# Patient Record
Sex: Female | Born: 1980 | Race: White | Hispanic: No | Marital: Married | Smoking: Former smoker
Health system: Southern US, Community
[De-identification: ages and names within clinical notes are randomized; demographics above are authoritative.]

## PROBLEM LIST (undated history)

## (undated) DIAGNOSIS — N939 Abnormal uterine and vaginal bleeding, unspecified: Secondary | ICD-10-CM

## (undated) DIAGNOSIS — O09529 Supervision of elderly multigravida, unspecified trimester: Secondary | ICD-10-CM

## (undated) DIAGNOSIS — Z3490 Encounter for supervision of normal pregnancy, unspecified, unspecified trimester: Principal | ICD-10-CM

## (undated) DIAGNOSIS — R1032 Left lower quadrant pain: Secondary | ICD-10-CM

## (undated) DIAGNOSIS — N390 Urinary tract infection, site not specified: Secondary | ICD-10-CM

## (undated) DIAGNOSIS — I10 Essential (primary) hypertension: Secondary | ICD-10-CM

## (undated) HISTORY — DX: Abnormal uterine and vaginal bleeding, unspecified: N93.9

## (undated) HISTORY — DX: Encounter for supervision of normal pregnancy, unspecified, unspecified trimester: Z34.90

## (undated) HISTORY — DX: Urinary tract infection, site not specified: N39.0

## (undated) HISTORY — DX: Supervision of elderly multigravida, unspecified trimester: O09.529

## (undated) HISTORY — DX: Left lower quadrant pain: R10.32

---

## 2001-01-17 ENCOUNTER — Ambulatory Visit (HOSPITAL_COMMUNITY): Admission: RE | Admit: 2001-01-17 | Discharge: 2001-01-17 | Payer: Self-pay | Admitting: Urology

## 2001-01-17 ENCOUNTER — Encounter: Payer: Self-pay | Admitting: Urology

## 2001-05-16 ENCOUNTER — Ambulatory Visit (HOSPITAL_COMMUNITY): Admission: RE | Admit: 2001-05-16 | Discharge: 2001-05-16 | Payer: Self-pay | Admitting: Obstetrics and Gynecology

## 2003-05-13 ENCOUNTER — Encounter (HOSPITAL_COMMUNITY): Admission: RE | Admit: 2003-05-13 | Discharge: 2003-05-27 | Payer: Self-pay | Admitting: Rheumatology

## 2003-08-31 ENCOUNTER — Emergency Department (HOSPITAL_COMMUNITY): Admission: EM | Admit: 2003-08-31 | Discharge: 2003-08-31 | Payer: Self-pay | Admitting: *Deleted

## 2004-08-27 HISTORY — PX: ECTOPIC PREGNANCY SURGERY: SHX613

## 2007-11-04 ENCOUNTER — Other Ambulatory Visit: Admission: RE | Admit: 2007-11-04 | Discharge: 2007-11-04 | Payer: Self-pay | Admitting: Obstetrics & Gynecology

## 2008-02-27 ENCOUNTER — Emergency Department (HOSPITAL_COMMUNITY): Admission: EM | Admit: 2008-02-27 | Discharge: 2008-02-27 | Payer: Self-pay | Admitting: Emergency Medicine

## 2008-03-13 ENCOUNTER — Observation Stay (HOSPITAL_COMMUNITY): Admission: AD | Admit: 2008-03-13 | Discharge: 2008-03-14 | Payer: Self-pay | Admitting: Obstetrics and Gynecology

## 2008-04-05 ENCOUNTER — Inpatient Hospital Stay (HOSPITAL_COMMUNITY): Admission: AD | Admit: 2008-04-05 | Discharge: 2008-04-05 | Payer: Self-pay | Admitting: Gynecology

## 2008-04-27 ENCOUNTER — Inpatient Hospital Stay (HOSPITAL_COMMUNITY): Admission: AD | Admit: 2008-04-27 | Discharge: 2008-04-28 | Payer: Self-pay | Admitting: Obstetrics & Gynecology

## 2008-04-27 ENCOUNTER — Ambulatory Visit: Payer: Self-pay | Admitting: Family Medicine

## 2010-07-15 ENCOUNTER — Emergency Department (HOSPITAL_COMMUNITY): Admission: EM | Admit: 2010-07-15 | Discharge: 2010-07-15 | Payer: Self-pay | Admitting: Emergency Medicine

## 2010-11-07 LAB — URINE CULTURE: Colony Count: >=100000

## 2010-11-07 LAB — BASIC METABOLIC PANEL
BUN: 4 mg/dL — ABNORMAL LOW (ref 6–23)
CO2: 30 mEq/L (ref 19–32)
Calcium: 9.7 mg/dL (ref 8.4–10.5)
Creatinine, Ser: 0.72 mg/dL (ref 0.4–1.2)
GFR calc non Af Amer: >60 mL/min (ref >60)
Glucose, Bld: 99 mg/dL (ref 70–99)
Sodium: 140 mEq/L (ref 135–145)

## 2010-11-07 LAB — URINALYSIS, ROUTINE W REFLEX MICROSCOPIC
Bilirubin Urine: NEGATIVE
Glucose, UA: NEGATIVE mg/dL
Ketones, ur: NEGATIVE mg/dL
Nitrite: POSITIVE — AB
Protein, ur: NEGATIVE mg/dL
Specific Gravity, Urine: 1.01 (ref 1.005–1.030)
Urobilinogen, UA: 0.2 mg/dL (ref 0.0–1.0)
pH: 7.5 (ref 5.0–8.0)

## 2010-11-07 LAB — URINE MICROSCOPIC-ADD ON

## 2011-01-09 NOTE — Discharge Summary (Signed)
NAME:  Andrea [name], Andrea [name] NO.:  [id number]   MEDICAL RECORD NO.:  [id number]          PATIENT TYPE:  OBV   LOCATION:  9158                          FACILITY:  [facility]   PHYSICIAN:  Tilda Burrow, M.D. DATE OF BIRTH:  1981/08/08   DATE OF ADMISSION:  03/13/2008  DATE OF DISCHARGE:  03/14/2008                               DISCHARGE SUMMARY   REASON FOR ADMISSION:  Pregnancy at 57 and 6 weeks' gestation with  diarrhea for 2 days with preterm labor.   DISCHARGE DIAGNOSES:  1. Gastroenteritis.  2. Preterm labor 2o to #1   HOSPITAL COURSE:  The patient improved tremendously with IV hydration  and 1 p.o. dose of Procardia XL 30.  At that point, she required no  other tocolysis with hydration.  She responded well to hydrating fluids,  was taking p.o. solids and fluids well without any further diarrhea, and  is to be discharged home today on March 14, 2008.   DISCHARGE MEDICATIONS:  She is going home at this time on prenatal  vitamins.  She requires no further tocolysis at the labor teaching and  the patient is to follow up on Wednesday with Dr. Emelda Fear and Dr. Despina Hidden  at [facility].  She declined vaginal exams since she has not had any  contractions since her IV hydration and p.o. tocolysis.      Zerita Boers, Lanier Clam      Tilda Burrow, M.D.  Electronically Signed    DL/MEDQ  D:  78/29/5621  T:  03/15/2008  Job:  [id number]   cc:   Tilda Burrow, M.D.  Fax: [phone]   [facility]

## 2011-01-12 NOTE — Consult Note (Signed)
NAME:  [name], [name]:  [id number]   MEDICAL RECORD NO.:  [id number]                   PATIENT TYPE:   LOCATION:                                       FACILITY:   PHYSICIAN:  Aundra Dubin, M.D.            DATE OF BIRTH:   DATE OF CONSULTATION:  05/13/2003  DATE OF DISCHARGE:                                   CONSULTATION   Doreen Beam, M.D.  [facility]  [address]  [place], [place] [address]   CHIEF COMPLAINT:  Muscles hurt every day.   May 13, 2003.   Dear Dr. Sherril Croon:   Thank you for this consultation.   Andrea [name] is a 30 year old white female who fell while at work in November,  landing on her right knee and back.  She tells me that she did have back x-  rays and these were normal.  Since this time, she wakes up in pain.  She has  non-restorative sleep.  She has a lot of pressure aching.  She finds that  she is uncomfortable sitting, standing, or lying.  There have been no  swollen joints.  She tells me that she hurts in her shoulders and arms, and  she has difficulty lifting her hands above her shoulders.  Her legs and  calves hurt.  She has no energy and she says that she feels as if she  wants to give up.  She does admit that she has some depressed feelings.  The  notes indicate, as she also tells me, that she has been on Vicodin,  Skelaxin, Flexeril, and some other medications to try and help this achiness  without significant relief.   REVIEW OF SYSTEMS:  On further review of systems, her sleep is  nonrestorative and she tosses and turns.  There has been no fever, rashes,  or psoriasis.  She is quite stiff in the mornings.  She has headaches which  are mild, about once a month.  She has diarrhea, tight BM about twice per  week.  There has been no constipation, blood, or mucus.  There has been no  chest pain or shortness of breath.   PAST MEDICAL HISTORY/SURGICAL HISTORY:  1. Kidney stone in 1998.  2.  Bladder infections.   MEDICINES:  Tylenol.   DRUG INTOLERANCES:  SULFA.   FAMILY HISTORY:  Her father is 3 years of age and has diabetes and a recent  three-vessel CABG.  Her mother is 63 years of age, has diabetes and numbness  to her legs.   SOCIAL HISTORY:  She is an [place] native.  She lives with a man she calls her  fiance for the last three years.  She has a 57-year-old daughter with him.  She completed high school.  She is presently waitressing.  She began smoking  at age 71 and smokes about a half a pack of cigarettes a  day.  No alcohol.   PHYSICAL EXAMINATION:  VITAL SIGNS:  Weight 124 pounds, blood pressure  100/50, respirations 16.  GENERAL:  She is of appropriate weight and appears tired.  SKIN:  She has freckles, but no active rashes.  She does have a tattoo  across her low back.  HEENT:  Normal hair pattern.  PERL/EOMI.  Mouth clear except for a tongue  stud.  NECK:  Slight upper cervical adenopathy.  Normal thyroid.  LUNGS:  Clear.  HEART:  Regular, no murmur.  ABDOMEN:  Negative HSM, nontender.  MUSCULOSKELETAL:  The hands, wrists, elbows, shoulders, and neck have a good  range of motion.  There is mild stiffness, but no pain with movement.  She  is able to fully internally and externally rotate the shoulders.  Trigger  points at the shoulder, neck, occiput, anterior chest, upper paraspinous  muscles, and all through the mid and low back.  These and Achilles were all  tender.  Hips, knees, ankles, and feet have a good range of motion and show  no active arthritis.  NEURO:  Strength is 5/5.  DTRs are 2+ throughout.  Negative SLR.   ASSESSMENT AND PLAN:  1. Polyarthralgia and insomnia: She has an aching, fatigue-type of syndrome     that is much like fibromyalgia.  The real diagnosis may be depression.  I     am electing to try further medicines for sleep at this time.  She is     agreeable to try Flexeril again, and I am adding Xanax to this.  She will     start  with Flexeril 10 mg and Xanax 0.25 mg h.s.  She can increase, with     the limits being 20 mg of Flexeril and 1 mg of Xanax.  In addition, I     have discussed with her that what may be going on is more depression.  I     will not place her on these types of medicines, but this might be a     reasonable place to try an SSRI.  2. Smoking: Strategies to quit were discussed.   Dr. Sherril Croon, as above, if the medicines for sleep are not making much help,  then I would suggest adding an SSRI antidepressant.  Medicine such as  Darvocet might be reasonable for pain, but as I say this, we are already  talking about four medicines at that point.  This is a fatigue, depression,  aching-type syndrome that is essentially fibromyalgia process.  I will be  seeing her on a p.r.n. basis.  As you know, this [facility] is closing at  the end of October.  Thank you.   Sincerely,                                                Aundra Dubin, M.D.    WWT/MEDQ  D:  05/13/2003  T:  05/13/2003  Job:  [id number]   cc:   Doreen Beam  [address]  [place]  [place] [address]  Fax: [phone]

## 2011-05-25 LAB — URINALYSIS, ROUTINE W REFLEX MICROSCOPIC
Bilirubin Urine: NEGATIVE
Hgb urine dipstick: NEGATIVE
Ketones, ur: NEGATIVE
Nitrite: NEGATIVE
Protein, ur: NEGATIVE
Urobilinogen, UA: 0.2

## 2011-05-30 LAB — CBC
MCHC: 34.2
MCV: 101.2 — ABNORMAL HIGH
Platelets: 434 — ABNORMAL HIGH
RBC: 3.23 — ABNORMAL LOW
RDW: 14.3

## 2011-05-30 LAB — RPR: RPR Ser Ql: NONREACTIVE

## 2012-11-17 IMAGING — CT CT ABD-PELV W/O CM
3 of 4 series · 8 of 46 positions shown, 15 images · non-contrast
Comparison: Left side abdominal pain

CLINICAL DATA: Left sided abdominal pain

CT ABDOMEN AND PELVIS WITHOUT CONTRAST
TECHNIQUE: Multidetector CT imaging of the abdomen and pelvis was
performed following the standard protocol without intravenous
contrast.

[Series 3: lung 5.0 b60f · axial · 0.66mm/px · z∈[-102,-42]mm · 4 of 22 slices shown, 9 images]
[im 5/22  soft-tissue]
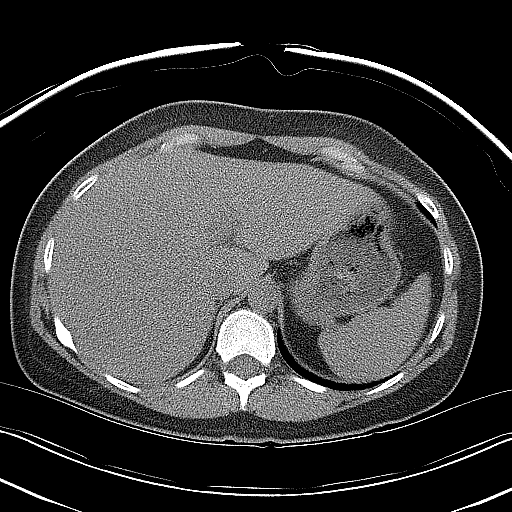
[im 5/22  lung]
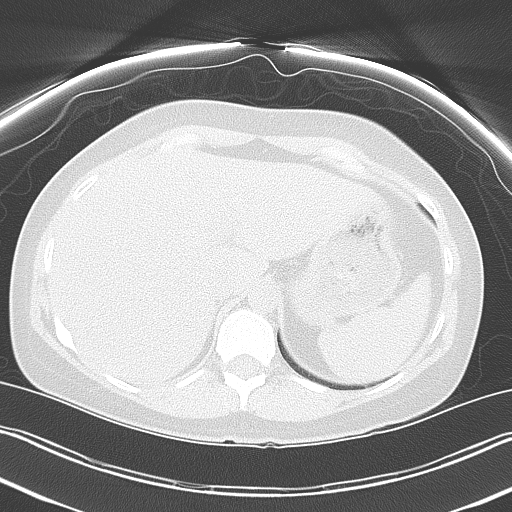
[im 5/22  bone]
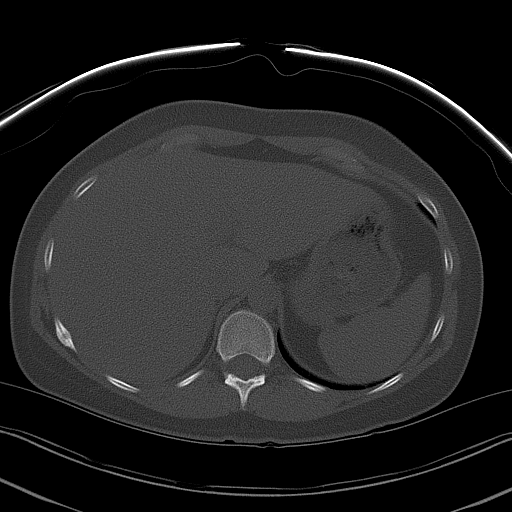
[im 9/22  soft-tissue]
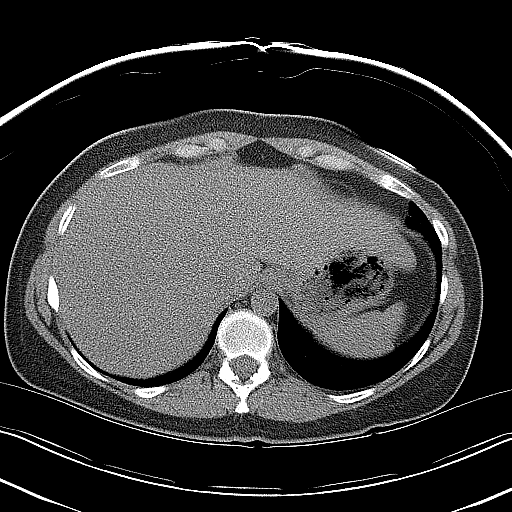
[im 9/22  lung]
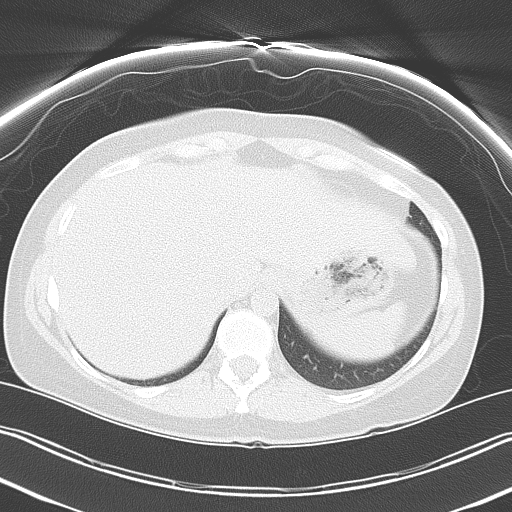
[im 13/22  soft-tissue]
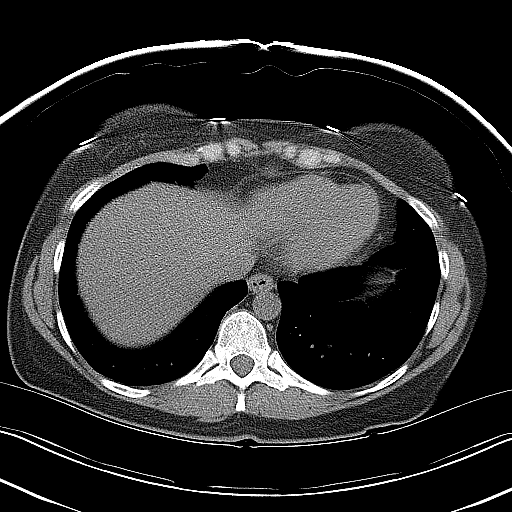
[im 13/22  lung]
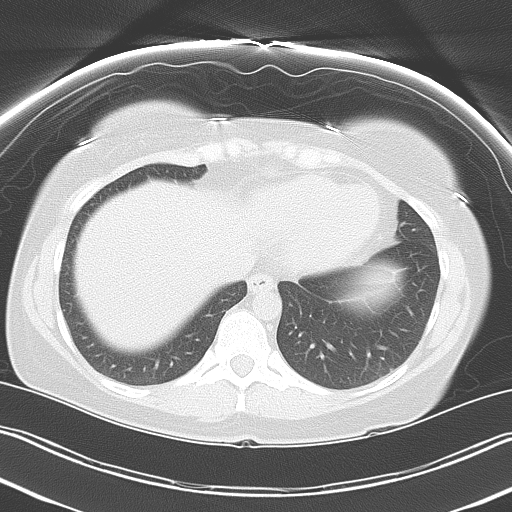
[im 17/22  soft-tissue]
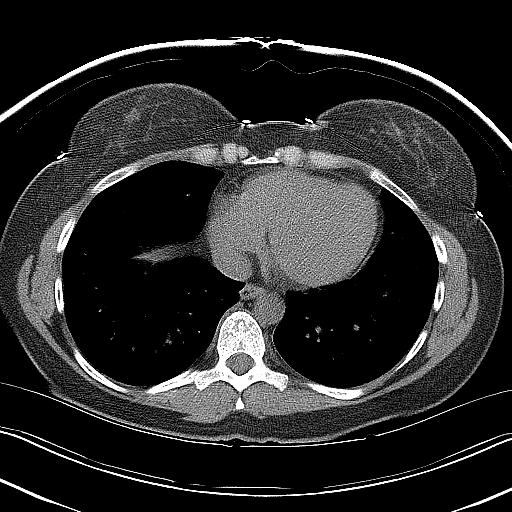
[im 17/22  lung]
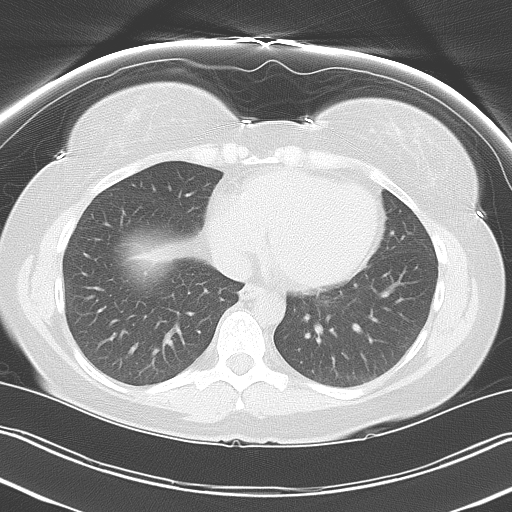

[Series 4: mpr coronal (id) · coronal · 0.65mm/px · 3 of 61 slices shown, 4 images]
[im 21/61  soft-tissue]
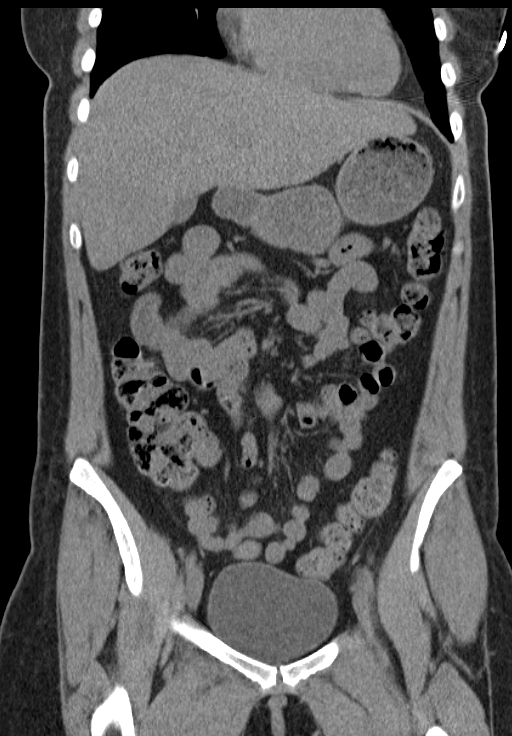
[im 27/61  soft-tissue]
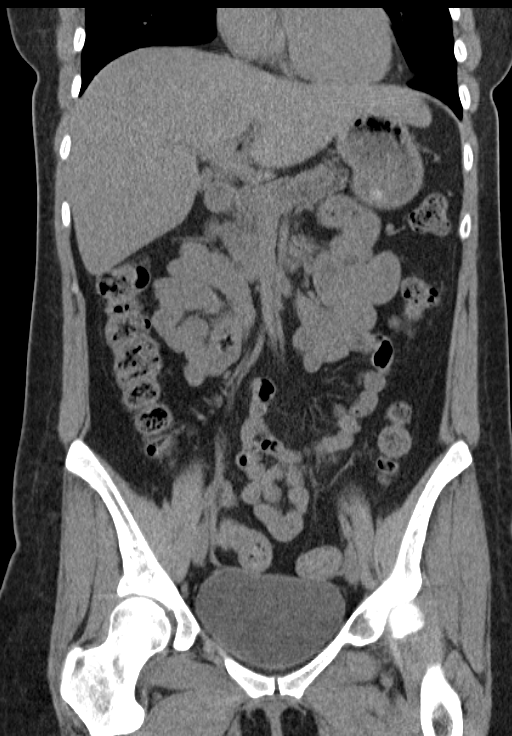
[im 27/61  bone]
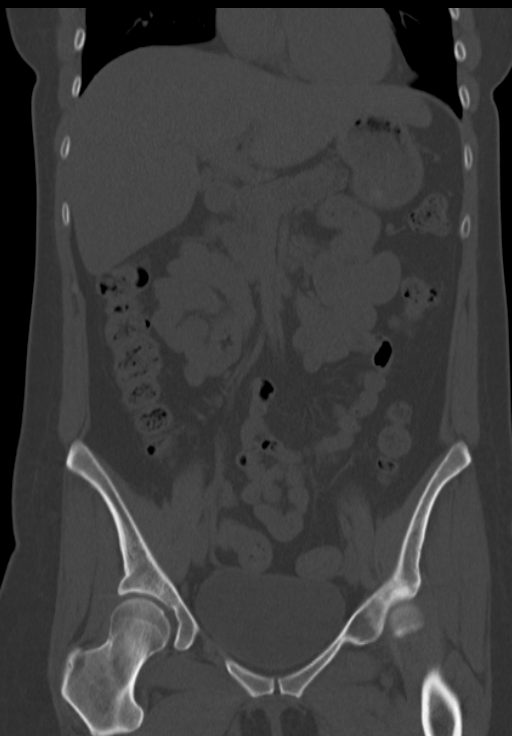
[im 34/61  soft-tissue]
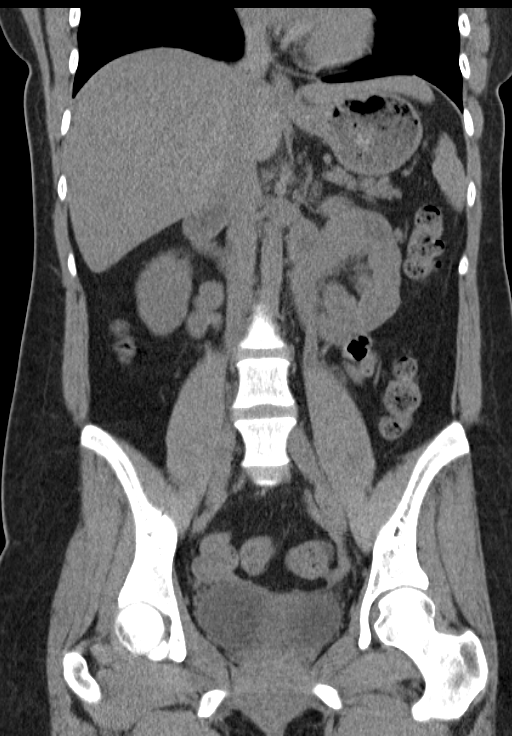

[Series 5: mpr sagittal (id) · sagittal · 0.43mm/px · 1 of 94 slices shown, 2 images]
[im 32/94  soft-tissue]
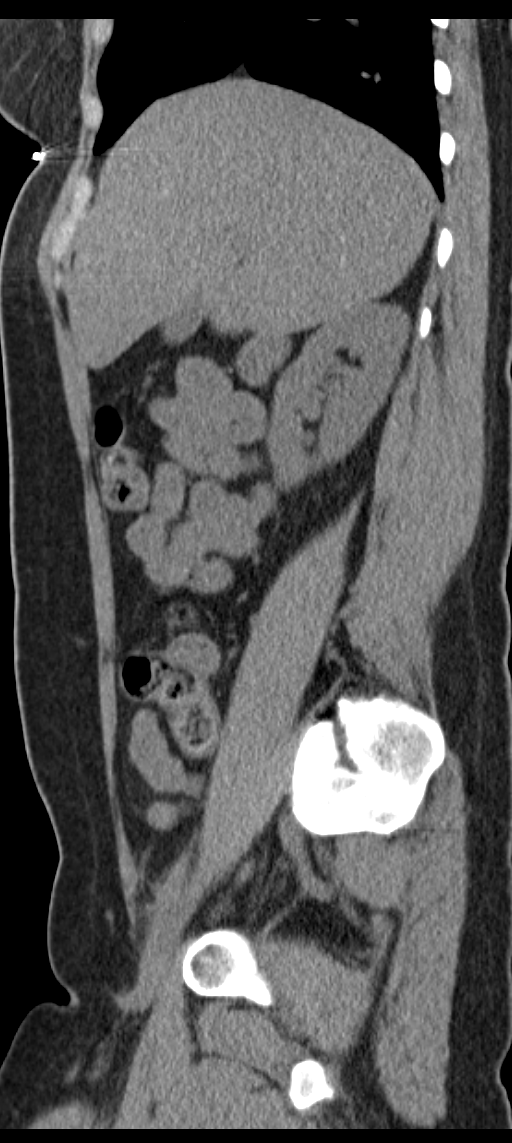
[im 32/94  bone]
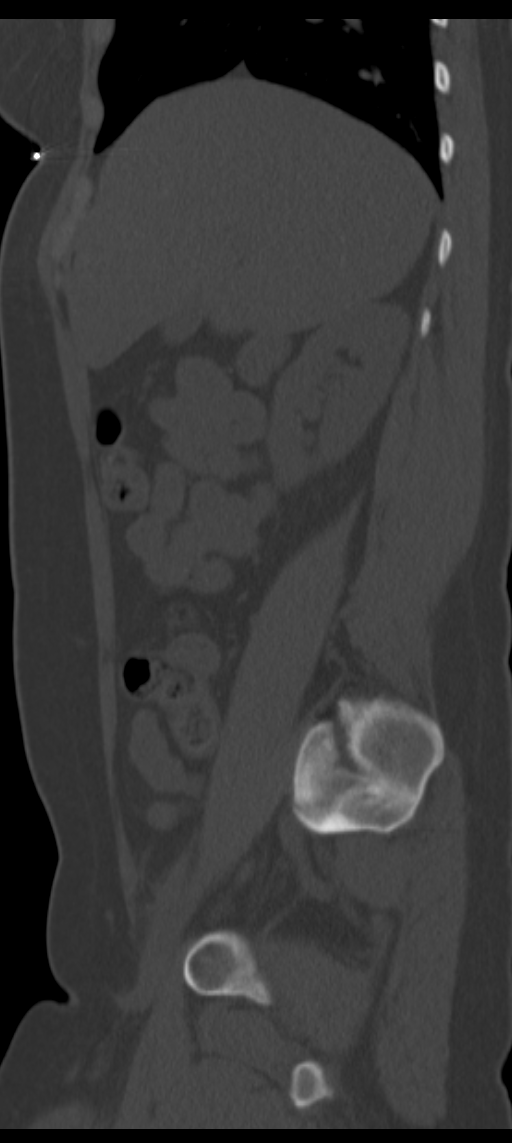

[8 of 46 positions shown; findings below may reference images not displayed]

FINDINGS: Lung bases are clear.

Non-IV contrast images demonstrate no focal hepatic lesion.  The
gallbladder, pancreas, spleen, and adrenal glands are normal.
There is a nonobstructing 3 mm calculus within the upper pole left
kidney.  No evidence of ureterolithiasis on the left or right.

The stomach, small bowel, and appendix, cecum are normal.  The
colon and rectosigmoid colon are normal.

Abdominal aorta is normal caliber.  No evidence retroperitoneal
lymphadenopathy.

No free fluid in the pelvis.  No evidence distal ureteral stones or
bladder stones.  There is a IUD within the endometrial canal.  The
adnexa are normal.  The bladder is normal.  No evidence of pelvic
lymphadenopathy. Review of  bone windows demonstrates no aggressive
osseous lesions.
IMPRESSION: 1.  No acute abdominal pelvic findings.
2.  Nonobstructing left nephrolithiasis.  No obstructive uropathy.
3.  IUD within the uterus.

## 2014-08-27 NOTE — L&D Delivery Note (Cosign Needed)
Delivery Note Andrea [name] is a 34 y.o. female [phone] at 1154w3d who presented on 06/16/2015 for IOL for IUGR < 3%. She was admitted to the Labor & Delivery unit and a foley bulb was placed for mechanical dilation. During her labor course she was augmented with pitocin and her membranes were artificially ruptured at 4.5 cm cervical dilation. An epidural was placed at the patient's request. She progressed to active labor without issue. Upon completion of cervical dilation she delivered with a single push. At 4:41 pm she delivered a healthy baby girl via uncomplicated spontaneous vaginal delivery (ROA).  APGAR: 9, 9 Wt: 5 lb 14.2 oz (2671 g) Placenta status: Intact, Spontaneous.   Cord: 3 vessels  Complications: None Anesthesia: Epidural  Episiotomy: None Lacerations: None Suture Repair: None Est. Blood Loss (mL): 100 cc  Mom to postpartum.  Baby to Couplet care / Skin to Skin.  Andrea SporeCaitlin [name] 06/17/2015, 4:58 PM  Patient is a [id number] at 6554w3d who was admitted for IOL, significant hx of IUGR.  She progressed with augmentation via foley bulb and Pitocin.  I was gloved and present for delivery in its entirety.  Second stage of labor progressed, baby delivered after 1 contraction.  No decels during second stage noted.  Complications: none  Lacerations: none   Andrea [name], Andrea [name], CNM 7:49 PM  06/17/2015

## 2014-11-17 ENCOUNTER — Ambulatory Visit (INDEPENDENT_AMBULATORY_CARE_PROVIDER_SITE_OTHER): Payer: Medicaid Other | Admitting: Adult Health

## 2014-11-17 ENCOUNTER — Ambulatory Visit (INDEPENDENT_AMBULATORY_CARE_PROVIDER_SITE_OTHER): Payer: Medicaid Other

## 2014-11-17 ENCOUNTER — Encounter: Payer: Self-pay | Admitting: Adult Health

## 2014-11-17 VITALS — BP 102/62 | HR 96 | Ht 62.0 in | Wt 148.5 lb

## 2014-11-17 DIAGNOSIS — Z3201 Encounter for pregnancy test, result positive: Secondary | ICD-10-CM

## 2014-11-17 DIAGNOSIS — O200 Threatened abortion: Secondary | ICD-10-CM | POA: Diagnosis not present

## 2014-11-17 DIAGNOSIS — N898 Other specified noninflammatory disorders of vagina: Secondary | ICD-10-CM

## 2014-11-17 DIAGNOSIS — Z1389 Encounter for screening for other disorder: Secondary | ICD-10-CM | POA: Diagnosis not present

## 2014-11-17 DIAGNOSIS — O09521 Supervision of elderly multigravida, first trimester: Secondary | ICD-10-CM

## 2014-11-17 DIAGNOSIS — N939 Abnormal uterine and vaginal bleeding, unspecified: Secondary | ICD-10-CM

## 2014-11-17 DIAGNOSIS — Z3490 Encounter for supervision of normal pregnancy, unspecified, unspecified trimester: Secondary | ICD-10-CM

## 2014-11-17 DIAGNOSIS — N390 Urinary tract infection, site not specified: Secondary | ICD-10-CM

## 2014-11-17 DIAGNOSIS — R1032 Left lower quadrant pain: Secondary | ICD-10-CM | POA: Diagnosis not present

## 2014-11-17 DIAGNOSIS — O09529 Supervision of elderly multigravida, unspecified trimester: Secondary | ICD-10-CM

## 2014-11-17 HISTORY — DX: Supervision of elderly multigravida, unspecified trimester: O09.529

## 2014-11-17 HISTORY — DX: Encounter for supervision of normal pregnancy, unspecified, unspecified trimester: Z34.90

## 2014-11-17 HISTORY — DX: Urinary tract infection, site not specified: N39.0

## 2014-11-17 HISTORY — DX: Abnormal uterine and vaginal bleeding, unspecified: N93.9

## 2014-11-17 HISTORY — DX: Left lower quadrant pain: R10.32

## 2014-11-17 LAB — POCT URINALYSIS DIPSTICK
Blood, UA: NEGATIVE
GLUCOSE UA: NEGATIVE
LEUKOCYTES UA: NEGATIVE
Nitrite, UA: POSITIVE

## 2014-11-17 LAB — POCT URINE PREGNANCY: PREG TEST UR: POSITIVE

## 2014-11-17 MED ORDER — NITROFURANTOIN MONOHYD MACRO 100 MG PO CAPS: 100.0000 mg | ORAL_CAPSULE | Freq: Two times a day (BID) | ORAL | Status: DC

## 2014-11-17 MED ORDER — PRENATAL PLUS 27-1 MG PO TABS: 1.0000 | ORAL_TABLET | Freq: Every day | ORAL | Status: DC

## 2014-11-17 NOTE — Progress Notes (Signed)
Subjective:     Patient ID: Andrea [name], female   DOB: 1980/10/17, 34 y.o.   MRN: [id number]  HPI Andrea [name] is a 34 year old white female in for UPT for missed period, has had spotting since yesterday and LLQ and back pain for few days.Has history of ectopic on right,and has 2 children.  Review of Systems +missed period, +LLQ pain + vaginal bleeding  Reviewed past medical,surgical, social and family history. Reviewed medications and allergies.     Objective:   Physical Exam BP 102/62 mmHg  Pulse 96  Ht 5\' 2"  (1.575 m)  Wt 148 lb 8 oz (67.359 kg)  BMI 27.15 kg/m2  LMP 08/28/2014 (Exact Date) UPT +, Skin warm and dry. Neck: mid line trachea, normal thyroid, good ROM, no lymphadenopathy noted. Lungs: clear to ausculation bilaterally. Cardiovascular: regular rate and rhythm.Abdomen soft +LLQ tenderness and left low back tenderness, US performed Has +IUP about 8+1 week with +FHM 161,medicaid form given.   Uine dipstick + nitrates and trace protein. She is not sure of blood type, will chek ABO RH today.  Assessment:     Pregnant +UPT Vaginal spotting LLQ pain UTI  AMA     Plan:     Check ABORH and UA &S sent Rx macrobid 1 bid x 7 days   Rx prenatal plus #30 1 daily with 11 refills Return in 1 week for new OB Review handout on first trimester Push fluids No sex til 7 days of no spotting

## 2014-11-17 NOTE — Progress Notes (Signed)
U/S 8W 1 D IUP , EDD 06/28/2015,pos fht,161BPM,rt ov wnl,limited view of lt ov,retroverted uterus 

## 2014-11-17 NOTE — Progress Notes (Deleted)
U/S 8W 1 D IUP , EDD 06/28/2015,pos fht,161BPM,rt ov wnl,limited view of lt ov,retroverted uterus 

## 2014-11-17 NOTE — Progress Notes (Deleted)
U/S 8W 1 D IUP , EDD 06/28/2015,pos fht,161BPM,rt ov wnl,limited view of lt ov,retroverted uterus

## 2014-11-17 NOTE — Patient Instructions (Signed)
First Trimester of Pregnancy The first trimester of pregnancy is from week 1 until the end of week 12 (months 1 through 3). A week after a sperm fertilizes an egg, the egg will implant on the wall of the uterus. This embryo will begin to develop into a baby. Genes from you and your partner are forming the baby. The female genes determine whether the baby is a boy or a girl. At 6-8 weeks, the eyes and face are formed, and the heartbeat can be seen on ultrasound. At the end of 12 weeks, all the baby's organs are formed.  Now that you are pregnant, you will want to do everything you can to have a healthy baby. Two of the most important things are to get good prenatal care and to follow your health care provider's instructions. Prenatal care is all the medical care you receive before the baby's birth. This care will help prevent, find, and treat any problems during the pregnancy and childbirth. BODY CHANGES Your body goes through many changes during pregnancy. The changes vary from woman to woman.   You may gain or lose a couple of pounds at first.  You may feel sick to your stomach (nauseous) and throw up (vomit). If the vomiting is uncontrollable, call your health care provider.  You may tire easily.  You may develop headaches that can be relieved by medicines approved by your health care provider.  You may urinate more often. Painful urination may mean you have a bladder infection.  You may develop heartburn as a result of your pregnancy.  You may develop constipation because certain hormones are causing the muscles that push waste through your intestines to slow down.  You may develop hemorrhoids or swollen, bulging veins (varicose veins).  Your breasts may begin to grow larger and become tender. Your nipples may stick out more, and the tissue that surrounds them (areola) may become darker.  Your gums may bleed and may be sensitive to brushing and flossing.  Dark spots or blotches (chloasma,  mask of pregnancy) may develop on your face. This will likely fade after the baby is born.  Your menstrual periods will stop.  You may have a loss of appetite.  You may develop cravings for certain kinds of food.  You may have changes in your emotions from day to day, such as being excited to be pregnant or being concerned that something may go wrong with the pregnancy and baby.  You may have more vivid and strange dreams.  You may have changes in your hair. These can include thickening of your hair, rapid growth, and changes in texture. Some women also have hair loss during or after pregnancy, or hair that feels dry or thin. Your hair will most likely return to normal after your baby is born. WHAT TO EXPECT AT YOUR PRENATAL VISITS During a routine prenatal visit:  You will be weighed to make sure you and the baby are growing normally.  Your blood pressure will be taken.  Your abdomen will be measured to track your baby's growth.  The fetal heartbeat will be listened to starting around week 10 or 12 of your pregnancy.  Test results from any previous visits will be discussed. Your health care provider may ask you:  How you are feeling.  If you are feeling the baby move.  If you have had any abnormal symptoms, such as leaking fluid, bleeding, severe headaches, or abdominal cramping.  If you have any questions. Other tests   that may be performed during your first trimester include:  Blood tests to find your blood type and to check for the presence of any previous infections. They will also be used to check for low iron levels (anemia) and Rh antibodies. Later in the pregnancy, blood tests for diabetes will be done along with other tests if problems develop.  Urine tests to check for infections, diabetes, or protein in the urine.  An ultrasound to confirm the proper growth and development of the baby.  An amniocentesis to check for possible genetic problems.  Fetal screens for  spina bifida and Down syndrome.  You may need other tests to make sure you and the baby are doing well. HOME CARE INSTRUCTIONS  Medicines  Follow your health care provider's instructions regarding medicine use. Specific medicines may be either safe or unsafe to take during pregnancy.  Take your prenatal vitamins as directed.  If you develop constipation, try taking a stool softener if your health care provider approves. Diet  Eat regular, well-balanced meals. Choose a variety of foods, such as meat or vegetable-based protein, fish, milk and low-fat dairy products, vegetables, fruits, and whole grain breads and cereals. Your health care provider will help you determine the amount of weight gain that is right for you.  Avoid raw meat and uncooked cheese. These carry germs that can cause birth defects in the baby.  Eating four or five small meals rather than three large meals a day may help relieve nausea and vomiting. If you start to feel nauseous, eating a few soda crackers can be helpful. Drinking liquids between meals instead of during meals also seems to help nausea and vomiting.  If you develop constipation, eat more high-fiber foods, such as fresh vegetables or fruit and whole grains. Drink enough fluids to keep your urine clear or pale yellow. Activity and Exercise  Exercise only as directed by your health care provider. Exercising will help you:  Control your weight.  Stay in shape.  Be prepared for labor and delivery.  Experiencing pain or cramping in the lower abdomen or low back is a good sign that you should stop exercising. Check with your health care provider before continuing normal exercises.  Try to avoid standing for long periods of time. Move your legs often if you must stand in one place for a long time.  Avoid heavy lifting.  Wear low-heeled shoes, and practice good posture.  You may continue to have sex unless your health care provider directs you  otherwise. Relief of Pain or Discomfort  Wear a good support bra for breast tenderness.   Take warm sitz baths to soothe any pain or discomfort caused by hemorrhoids. Use hemorrhoid cream if your health care provider approves.   Rest with your legs elevated if you have leg cramps or low back pain.  If you develop varicose veins in your legs, wear support hose. Elevate your feet for 15 minutes, 3-4 times a day. Limit salt in your diet. Prenatal Care  Schedule your prenatal visits by the twelfth week of pregnancy. They are usually scheduled monthly at first, then more often in the last 2 months before delivery.  Write down your questions. Take them to your prenatal visits.  Keep all your prenatal visits as directed by your health care provider. Safety  Wear your seat belt at all times when driving.  Make a list of emergency phone numbers, including numbers for family, friends, the hospital, and police and fire departments. General Tips    Ask your health care provider for a referral to a local prenatal education class. Begin classes no later than at the beginning of month 6 of your pregnancy.  Ask for help if you have counseling or nutritional needs during pregnancy. Your health care provider can offer advice or refer you to specialists for help with various needs.  Do not use hot tubs, steam rooms, or saunas.  Do not douche or use tampons or scented sanitary pads.  Do not cross your legs for long periods of time.  Avoid cat litter boxes and soil used by cats. These carry germs that can cause birth defects in the baby and possibly loss of the fetus by miscarriage or stillbirth.  Avoid all smoking, herbs, alcohol, and medicines not prescribed by your health care provider. Chemicals in these affect the formation and growth of the baby.  Schedule a dentist appointment. At home, brush your teeth with a soft toothbrush and be gentle when you floss. SEEK MEDICAL CARE IF:   You have  dizziness.  You have mild pelvic cramps, pelvic pressure, or nagging pain in the abdominal area.  You have persistent nausea, vomiting, or diarrhea.  You have a bad smelling vaginal discharge.  You have pain with urination.  You notice increased swelling in your face, hands, legs, or ankles. SEEK IMMEDIATE MEDICAL CARE IF:   You have a fever.  You are leaking fluid from your vagina.  You have spotting or bleeding from your vagina.  You have severe abdominal cramping or pain.  You have rapid weight gain or loss.  You vomit blood or material that looks like coffee grounds.  You are exposed to [place] measles and have never had them.  You are exposed to fifth disease or chickenpox.  You develop a severe headache.  You have shortness of breath.  You have any kind of trauma, such as from a fall or a car accident. Document Released: 08/07/2001 Document Revised: 12/28/2013 Document Reviewed: 06/23/2013 [facility] Patient Information 2015 [employer] [place]. This information is not intended to replace advice given to you by your health care provider. Make sure you discuss any questions you have with your health care provider. Return in 1 week for new OB Push fluids No sex

## 2014-11-18 ENCOUNTER — Telehealth: Payer: Self-pay | Admitting: Adult Health

## 2014-11-18 LAB — URINALYSIS, ROUTINE W REFLEX MICROSCOPIC
Bilirubin, UA: NEGATIVE
Glucose, UA: NEGATIVE
Ketones, UA: NEGATIVE
NITRITE UA: POSITIVE — AB
PH UA: 6 (ref 5.0–7.5)
SPEC GRAV UA: 1.028 (ref 1.005–1.030)
Urobilinogen, Ur: 0.2 mg/dL (ref 0.2–1.0)

## 2014-11-18 LAB — MICROSCOPIC EXAMINATION: CASTS: NONE SEEN /LPF

## 2014-11-18 LAB — ABO/RH: Rh Factor: POSITIVE

## 2014-11-18 NOTE — Telephone Encounter (Signed)
Pt aware she is O+ 

## 2014-11-19 LAB — URINE CULTURE

## 2014-11-22 ENCOUNTER — Telehealth: Payer: Self-pay | Admitting: Adult Health

## 2014-11-22 MED ORDER — AMPICILLIN 500 MG PO CAPS: 500.0000 mg | ORAL_CAPSULE | Freq: Four times a day (QID) | ORAL | Status: DC

## 2014-11-22 NOTE — Telephone Encounter (Signed)
Stop Macrobid Rx ampicillin 50 mg 1 qid x 7 days

## 2014-11-25 ENCOUNTER — Telehealth: Payer: Self-pay | Admitting: *Deleted

## 2014-11-25 MED ORDER — FLUCONAZOLE 150 MG PO TABS: 150.0000 mg | ORAL_TABLET | Freq: Once | ORAL | Status: DC

## 2014-11-25 NOTE — Telephone Encounter (Signed)
Complains of yeast infection after taking ampicillin for UTI will rx diflucan 150 mg x 1

## 2014-11-25 NOTE — Telephone Encounter (Signed)
Spoke with pt. Pt has been on Ampicillin. It has caused a yeast infection. She is having vaginal itching. What do you advised? Thanks!! JSY

## 2014-11-30 ENCOUNTER — Encounter: Payer: Self-pay | Admitting: Women's Health

## 2014-11-30 ENCOUNTER — Ambulatory Visit (INDEPENDENT_AMBULATORY_CARE_PROVIDER_SITE_OTHER): Payer: Medicaid Other | Admitting: Women's Health

## 2014-11-30 VITALS — BP 96/56 | HR 76 | Wt 147.0 lb

## 2014-11-30 DIAGNOSIS — O26891 Other specified pregnancy related conditions, first trimester: Secondary | ICD-10-CM

## 2014-11-30 DIAGNOSIS — N898 Other specified noninflammatory disorders of vagina: Secondary | ICD-10-CM

## 2014-11-30 DIAGNOSIS — O09211 Supervision of pregnancy with history of pre-term labor, first trimester: Secondary | ICD-10-CM

## 2014-11-30 DIAGNOSIS — B373 Candidiasis of vulva and vagina: Secondary | ICD-10-CM

## 2014-11-30 DIAGNOSIS — Z1389 Encounter for screening for other disorder: Secondary | ICD-10-CM

## 2014-11-30 DIAGNOSIS — Z3490 Encounter for supervision of normal pregnancy, unspecified, unspecified trimester: Secondary | ICD-10-CM | POA: Insufficient documentation

## 2014-11-30 DIAGNOSIS — O09891 Supervision of other high risk pregnancies, first trimester: Secondary | ICD-10-CM

## 2014-11-30 DIAGNOSIS — Z331 Pregnant state, incidental: Secondary | ICD-10-CM

## 2014-11-30 DIAGNOSIS — O09521 Supervision of elderly multigravida, first trimester: Secondary | ICD-10-CM

## 2014-11-30 DIAGNOSIS — Z3491 Encounter for supervision of normal pregnancy, unspecified, first trimester: Secondary | ICD-10-CM

## 2014-11-30 DIAGNOSIS — Z0283 Encounter for blood-alcohol and blood-drug test: Secondary | ICD-10-CM

## 2014-11-30 DIAGNOSIS — Z369 Encounter for antenatal screening, unspecified: Secondary | ICD-10-CM

## 2014-11-30 DIAGNOSIS — O09219 Supervision of pregnancy with history of pre-term labor, unspecified trimester: Secondary | ICD-10-CM

## 2014-11-30 DIAGNOSIS — B3731 Acute candidiasis of vulva and vagina: Secondary | ICD-10-CM

## 2014-11-30 DIAGNOSIS — O09899 Supervision of other high risk pregnancies, unspecified trimester: Secondary | ICD-10-CM | POA: Insufficient documentation

## 2014-11-30 DIAGNOSIS — Z3682 Encounter for antenatal screening for nuchal translucency: Secondary | ICD-10-CM

## 2014-11-30 LAB — POCT WET PREP (WET MOUNT): CLUE CELLS WET PREP WHIFF POC: NEGATIVE

## 2014-11-30 LAB — POCT URINALYSIS DIPSTICK
Glucose, UA: NEGATIVE
KETONES UA: NEGATIVE
LEUKOCYTES UA: NEGATIVE
NITRITE UA: NEGATIVE
PROTEIN UA: NEGATIVE

## 2014-11-30 LAB — OB RESULTS CONSOLE ABO/RH: RH TYPE: POSITIVE

## 2014-11-30 LAB — OB RESULTS CONSOLE HIV ANTIBODY (ROUTINE TESTING): HIV: NONREACTIVE

## 2014-11-30 LAB — OB RESULTS CONSOLE RUBELLA ANTIBODY, IGM: Rubella: IMMUNE

## 2014-11-30 MED ORDER — TERCONAZOLE 0.4 % VA CREA: 1.0000 | TOPICAL_CREAM | Freq: Every day | VAGINAL | Status: DC

## 2014-11-30 NOTE — Patient Instructions (Signed)

## 2014-11-30 NOTE — Progress Notes (Signed)
Subjective:  Andrea [name] is a 34 y.o. [phone] Caucasian female at 283w0d by 8wk u/s,  being seen today for her first obstetrical visit.  Her obstetrical history is significant for AMA, h/o 35wk ptb d/t pprom, then subsequent full term birth.  Pregnancy history fully reviewed.  Patient reports n/v- declines meds. Had recent UTI & yeast infection, took diflucan x 1 but still having thick white d/c and some itching. Denies vb, cramping, uti s/s, abnormal/malodorous vag d/c, or vulvovaginal itching/irritation.  BP 96/56 mmHg  Pulse 76  Wt 147 lb (66.679 kg)  LMP 08/28/2014 (Exact Date)  HISTORY: OB History  Gravida Para Term Preterm AB SAB TAB Ectopic Multiple Living  4 2 1 1 1   1  2     # Outcome Date GA Lbr Len/2nd Weight Sex Delivery Anes PTL Lv  4 Current           3 Term 04/27/08 5836w1d  6 lb (2.722 kg) F Vag-Spont EPI Y Y  2 Preterm 12/12/99 668w0d  6 lb 4 oz (2.835 kg) F  EPI Y Y  1 Ectopic              Past Medical History  Diagnosis Date  . Pregnant 11/17/2014  . Vaginal spotting 11/17/2014  . LLQ pain 11/17/2014  . UTI (lower urinary tract infection) 11/17/2014  . AMA (advanced maternal age) multigravida 35+ 11/17/2014   Past Surgical History  Procedure Laterality Date  . Ectopic pregnancy surgery Right 2006   Family History  Problem Relation Age of Onset  . Diabetes Mother   . Hypertension Mother   . Other Mother     heart bypass  . Congestive Heart Failure Mother   . Diabetes Father   . Hypertension Father   . Other Father     heart bypass  . Other Daughter     Alpha Gale Syndrome  . Cancer Maternal Grandmother     Exam   System:     General: Well developed & nourished, no acute distress   Skin: Warm & dry, normal coloration and turgor, no rashes   Neurologic: Alert & oriented, normal mood   Cardiovascular: Regular rate & rhythm   Respiratory: Effort & rate normal, LCTAB, acyanotic   Abdomen: Soft, non tender   Extremities: normal strength, tone    Pelvic Exam:    Perineum: Normal perineum   Vulva: Normal, no lesions   Vagina:  Normal mucosa, thick white d/c   Cervix: Normal, bulbous, appears closed   Uterus: Normal size/shape/contour for GA   Thin prep pap smear neg 2015 @ [facility] per pt  FHR: 150s via informal transabdominal u/s   Results for orders placed or performed in visit on 11/30/14 (from the past 24 hour(s))  POCT Urinalysis Dipstick     Status: None   Collection Time: 11/30/14 10:10 AM  Result Value Ref Range   Color, UA     Clarity, UA     Glucose, UA neg    Bilirubin, UA     Ketones, UA neg    Spec Grav, UA     Blood, UA moderate    pH, UA     Protein, UA neg    Urobilinogen, UA     Nitrite, UA neg    Leukocytes, UA Negative   POCT Wet Prep Mellody Drown(Wet [place])     Status: Abnormal   Collection Time: 11/30/14 10:36 AM  Result Value Ref Range   Source Wet Prep POC vaginal  WBC, Wet Prep HPF POC none    Bacteria Wet Prep HPF POC none    BACTERIA WET PREP MORPHOLOGY POC     Clue Cells Wet Prep HPF POC None    Clue Cells Wet Prep Whiff POC Negative Whiff    Yeast Wet Prep HPF POC Few    KOH Wet Prep POC     Trichomonas Wet Prep HPF POC none     Assessment:   Pregnancy: [id number] Patient Active Problem List   Diagnosis Date Noted  . Supervision of normal pregnancy 11/30/2014    Priority: High  . AMA (advanced maternal age) multigravida 35+ 11/17/2014    Priority: High  . Vaginal spotting 11/17/2014  . LLQ pain 11/17/2014  . UTI (lower urinary tract infection) 11/17/2014    [redacted]w[redacted]d [id number] New OB visit Vulvovaginal candida AMA H/O PTB @ 35wks d/t PPROM   Plan:  Initial labs drawn Continue prenatal vitamins Problem list reviewed and updated Reviewed n/v relief measures and warning s/s to report Reviewed recommended weight gain based on pre-gravid BMI Encouraged well-balanced diet Genetic Screening discussed Integrated Screen: requested Cystic fibrosis screening discussed requested Ultrasound  discussed; fetal survey: requested Follow up in 2 weeks for 1st it/nt and visit [facility] completed Offered Makena, doesn't think she wants it but will think about it over next 2wks- gave brochure Rx terazole 7 for yeast  Marge Duncans CNM, [facility] 11/30/2014 10:35 AM

## 2014-12-01 LAB — URINE CULTURE

## 2014-12-01 LAB — GC/CHLAMYDIA PROBE AMP
CHLAMYDIA, DNA PROBE: NEGATIVE
NEISSERIA GONORRHOEAE BY PCR: NEGATIVE

## 2014-12-04 LAB — PMP SCREEN PROFILE (10S), URINE
Amphetamine Screen, Ur: NEGATIVE ng/mL
Barbiturate Screen, Ur: NEGATIVE ng/mL
Benzodiazepine Screen, Urine: NEGATIVE ng/mL
Cannabinoids Ur Ql Scn: NEGATIVE ng/mL
Cocaine(Metab.)Screen, Urine: NEGATIVE ng/mL
Creatinine(Crt), U: 156.1 mg/dL (ref 20.0–300.0)
Methadone Scn, Ur: NEGATIVE ng/mL
Opiate Scrn, Ur: NEGATIVE ng/mL
Oxycodone+Oxymorphone Ur Ql Scn: NEGATIVE ng/mL
PCP Scrn, Ur: NEGATIVE ng/mL
Ph of Urine: 6.8 (ref 4.5–8.9)
Propoxyphene, Screen: NEGATIVE ng/mL

## 2014-12-04 LAB — URINALYSIS, ROUTINE W REFLEX MICROSCOPIC
Bilirubin, UA: NEGATIVE
GLUCOSE, UA: NEGATIVE
KETONES UA: NEGATIVE
LEUKOCYTES UA: NEGATIVE
Nitrite, UA: NEGATIVE
PROTEIN UA: NEGATIVE
SPEC GRAV UA: 1.021 (ref 1.005–1.030)
Urobilinogen, Ur: 0.2 mg/dL (ref 0.2–1.0)
pH, UA: 6.5 (ref 5.0–7.5)

## 2014-12-04 LAB — ANTIBODY SCREEN: ANTIBODY SCREEN: NEGATIVE

## 2014-12-04 LAB — CBC
HEMATOCRIT: 38.1 % (ref 34.0–46.6)
Hemoglobin: 12.7 g/dL (ref 11.1–15.9)
MCH: 31.6 pg (ref 26.6–33.0)
MCHC: 33.3 g/dL (ref 31.5–35.7)
MCV: 95 fL (ref 79–97)
Platelets: 391 10*3/uL — ABNORMAL HIGH (ref 150–379)
RBC: 4.02 x10E6/uL (ref 3.77–5.28)
RDW: 14.5 % (ref 12.3–15.4)
WBC: 11.9 10*3/uL — ABNORMAL HIGH (ref 3.4–10.8)

## 2014-12-04 LAB — MICROSCOPIC EXAMINATION
Casts: NONE SEEN /LPF
Epithelial Cells (non renal): >10 /HPF — AB

## 2014-12-04 LAB — VARICELLA ZOSTER ANTIBODY, IGG: Varicella zoster IgG: >4000 index (ref >165)

## 2014-12-04 LAB — RUBELLA SCREEN: Rubella Antibodies, IGG: 3.98 index (ref >0.99)

## 2014-12-04 LAB — RPR: RPR Ser Ql: NONREACTIVE

## 2014-12-04 LAB — ABO/RH: Rh Factor: POSITIVE

## 2014-12-04 LAB — CYSTIC FIBROSIS MUTATION 97: Interpretation: NOT DETECTED

## 2014-12-04 LAB — HEPATITIS B SURFACE ANTIGEN: Hepatitis B Surface Ag: NEGATIVE

## 2014-12-04 LAB — HIV ANTIBODY (ROUTINE TESTING W REFLEX): HIV Screen 4th Generation wRfx: NONREACTIVE

## 2014-12-22 ENCOUNTER — Telehealth: Payer: Self-pay | Admitting: *Deleted

## 2014-12-22 ENCOUNTER — Ambulatory Visit (INDEPENDENT_AMBULATORY_CARE_PROVIDER_SITE_OTHER): Payer: Medicaid Other

## 2014-12-22 ENCOUNTER — Ambulatory Visit (INDEPENDENT_AMBULATORY_CARE_PROVIDER_SITE_OTHER): Payer: Medicaid Other | Admitting: Advanced Practice Midwife

## 2014-12-22 VITALS — BP 140/74 | HR 84 | Wt 144.0 lb

## 2014-12-22 DIAGNOSIS — O09211 Supervision of pregnancy with history of pre-term labor, first trimester: Secondary | ICD-10-CM

## 2014-12-22 DIAGNOSIS — Z3491 Encounter for supervision of normal pregnancy, unspecified, first trimester: Secondary | ICD-10-CM

## 2014-12-22 DIAGNOSIS — Z3682 Encounter for antenatal screening for nuchal translucency: Secondary | ICD-10-CM

## 2014-12-22 DIAGNOSIS — Z36 Encounter for antenatal screening of mother: Secondary | ICD-10-CM | POA: Diagnosis not present

## 2014-12-22 DIAGNOSIS — Z1389 Encounter for screening for other disorder: Secondary | ICD-10-CM

## 2014-12-22 DIAGNOSIS — Z331 Pregnant state, incidental: Secondary | ICD-10-CM

## 2014-12-22 DIAGNOSIS — O283 Abnormal ultrasonic finding on antenatal screening of mother: Secondary | ICD-10-CM | POA: Insufficient documentation

## 2014-12-22 DIAGNOSIS — O09891 Supervision of other high risk pregnancies, first trimester: Secondary | ICD-10-CM

## 2014-12-22 LAB — POCT URINALYSIS DIPSTICK
Blood, UA: 3
GLUCOSE UA: NEGATIVE
Ketones, UA: NEGATIVE
LEUKOCYTES UA: NEGATIVE
NITRITE UA: NEGATIVE

## 2014-12-22 NOTE — Progress Notes (Signed)
[id number] 4239w1d Estimated Date of Delivery: 06/28/15  Blood pressure 140/74, pulse 84, weight 144 lb (65.318 kg), last menstrual period 08/28/2014.   BP weight and urine results all reviewed and noted.  Please refer to the obstetrical flow sheet for the fundal height and fetal heart rate documentation:  NT/NB Ultrasound completed today. Single, active fetus at 13+[redacted] weeks GA with FHR of 146 bpm. CRL measures 70.508mm which is consistent with dating. NT appears enlarged with largest measurement at 3.413mm with cystic hygroma appearance. Nasal bone is present. Questionable echogenic bowel. Right ovary appears normal. Left ovary not visualized today.                   Patient has seen "a tiny bit of spotting",  no rupture of membranes symptoms or regular contractions. C/O vaginal burning for "a while". Feels "different than the UTI"  Counseled re cystic hygroma.  Will schedule genetic counseling and ordered CFDNA today.  All questions were answered.  Plan:  Continued routine obstetrical care, refered to genetic counseling with MFM (they will call her to scheudle appt)  Follow up in 4 weeks for OB appointment,

## 2014-12-22 NOTE — Patient Instructions (Signed)
Cystic Hygroma

## 2014-12-22 NOTE — Progress Notes (Signed)
NT/NB Ultrasound completed today.  Single, active fetus at 13+[redacted] weeks GA with FHR of 146 bpm.  CRL measures 70.658mm which is consistent with dating. NT appears enlarged with largest measurement at 3.483mm with cystic hygroma appearance.  Nasal bone is present.  Questionable echogenic bowel.  Right ovary appears normal.  Left ovary not visualized today.

## 2014-12-22 NOTE — Telephone Encounter (Signed)
Pt aware will need to go to [facility] and repeat labs due to lab drew for NTIT verses Infor[facility]. Pt verbalized understanding.

## 2014-12-24 ENCOUNTER — Other Ambulatory Visit: Payer: Self-pay | Admitting: Women's Health

## 2014-12-24 DIAGNOSIS — Z3682 Encounter for antenatal screening for nuchal translucency: Secondary | ICD-10-CM

## 2014-12-24 LAB — URINE CULTURE: Organism ID, Bacteria: NO GROWTH

## 2014-12-28 ENCOUNTER — Ambulatory Visit (HOSPITAL_COMMUNITY)
Admission: RE | Admit: 2014-12-28 | Discharge: 2014-12-28 | Disposition: A | Discharge status: Home / Self Care | Payer: Medicaid Other | Source: Ambulatory Visit | Attending: Obstetrics & Gynecology | Admitting: Obstetrics & Gynecology

## 2014-12-28 ENCOUNTER — Ambulatory Visit (HOSPITAL_COMMUNITY): Admission: RE | Admit: 2014-12-28 | Payer: Medicaid Other | Source: Ambulatory Visit

## 2014-12-28 ENCOUNTER — Other Ambulatory Visit: Payer: Self-pay | Admitting: Women's Health

## 2014-12-28 DIAGNOSIS — Z3682 Encounter for antenatal screening for nuchal translucency: Secondary | ICD-10-CM

## 2014-12-28 DIAGNOSIS — Z3A14 14 weeks gestation of pregnancy: Secondary | ICD-10-CM | POA: Insufficient documentation

## 2014-12-28 DIAGNOSIS — O99332 Smoking (tobacco) complicating pregnancy, second trimester: Secondary | ICD-10-CM | POA: Diagnosis not present

## 2014-12-28 DIAGNOSIS — F17210 Nicotine dependence, cigarettes, uncomplicated: Secondary | ICD-10-CM | POA: Insufficient documentation

## 2014-12-28 DIAGNOSIS — O09529 Supervision of elderly multigravida, unspecified trimester: Secondary | ICD-10-CM

## 2014-12-28 DIAGNOSIS — IMO0001 Reserved for inherently not codable concepts without codable children: Secondary | ICD-10-CM

## 2014-12-28 DIAGNOSIS — O09522 Supervision of elderly multigravida, second trimester: Secondary | ICD-10-CM | POA: Diagnosis not present

## 2014-12-28 DIAGNOSIS — IMO0002 Reserved for concepts with insufficient information to code with codable children: Secondary | ICD-10-CM | POA: Insufficient documentation

## 2014-12-28 DIAGNOSIS — O359XX0 Maternal care for (suspected) fetal abnormality and damage, unspecified, not applicable or unspecified: Secondary | ICD-10-CM | POA: Insufficient documentation

## 2014-12-28 DIAGNOSIS — Z315 Encounter for genetic counseling: Secondary | ICD-10-CM | POA: Insufficient documentation

## 2014-12-28 DIAGNOSIS — O358XX0 Maternal care for other (suspected) fetal abnormality and damage, not applicable or unspecified: Principal | ICD-10-CM

## 2014-12-28 NOTE — Progress Notes (Signed)
Genetic Counseling  High-Risk Gestation Note  Appointment Date:  12/28/2014 Referred By: Andrea Arms, MD Date of Birth:  [birth year] Partner:  Andrea [name]   Pregnancy History: [id number] Estimated Date of Delivery: 06/28/15 Estimated Gestational Age: [redacted]w[redacted]d Attending: Particia Nearing, MD   Andrea [name] and her husband, Andrea [name], were seen for genetic counseling because of the previous ultrasound finding of cystic hygroma. The patient is 35 years old. The were accompanied by the patient's friend today.    Andrea [name] had a routine nuchal translucency ultrasound through her primary obstetrician's office, which revealed an increased nuchal translucency versus a cystic hygroma.  The measurement was reported to be 3.3 mm. The patient was [facility] for follow-up ultrasound, consultation, and genetic counseling.  A limited ultrasound was performed today.  The report will be documented separately.  Ultrasound today revealed small bilateral, nuchal fetal cystic hygromas.    The couple was counseled that a cystic hygroma describes a septated fluid filled sac at the back of the neck that typically results from failure of the fetal lymphatic system.  We discussed the various etiologies for a hygroma including normal variation (immature lymphatic system), a chromosome condition, single gene condition, or a congenital anomaly (heart defect).   We discussed chromosomes, non-disjunction, and age related risks for aneuploidy (1 in 21). We reviewed that given the reported measurement of the cystic hygroma, the risk for fetal aneuploidy is increased to approximately 20%.  We discussed the common features of Down syndrome, Turner syndrome, and trisomies 13 and 18.  Andrea [name] previously had noninvasive prenatal screening (NIPS)/cell free DNA (cfDNA) testing through her obstetrician's office.  This test, InformaSeq through [facility] was drawn on  4/27 and is currently pending. We reviewed that while this testing has a high sensitivity and specificity, it is not considered to be diagnostic.  For trisomy 21, the detection rate is approximately 99, approximately 98% for trisomies 18 and 13, and approximately 95% for Turner syndrome.   We also discussed that cystic hygromas can also be caused by a variety of other chromosome aberrations including: microdeletions, microduplications, and translocations.  She was counseled that NIPS would not typically assess for these conditions.  We reviewed the availability of diagnostic testing (chromosome analysis) via [facility] and amniocentesis.  She was counseled regarding the associated risks for complications for each.  Additionally, microarray analysis can be performed on cells obtained from amniocentesis.  Microarray analysis is a molecular based technique in which a test sample of DNA (fetal) is compared to a reference (normal) genome in order to determine if the test sample has any extra or missing genetic information.  Microarray analysis allows for the detection of genetic deletions and duplications that are 100 times smaller than those identified by routine chromosome analysis.  Approximately 6% of patients with an abnormal fetal ultrasound and a normal fetal karyotype had a significant microdeletion/microduplication detected by prenatal microarray analysis.  After careful consideration, Andrea [name] declined amniocentesis at this time. She planned to further consider this option pending results of NIPS.    We also discussed that a cystic hygroma can be a feature of many underlying single gene conditions, such as Noonan syndrome, SLOS, and skeletal dysplasias.  We discussed that prenatal diagnosis is available for some single gene conditions, but that in most cases targeted analysis is recommended by a medical geneticist following a post-natal physical exam and review of medical records.  If however, ultrasound findings  or a positive family history strongly suggest an increased risk for a specific condition, testing may be performed prenatally.  Detailed ultrasound is scheduled for 01/27/15.   Additionally, we discussed that congenital heart defects (CHD) are commonly associated with cystic hygromas.  The prevalence of a CHD is in the order of 6 times higher in fetuses with a cystic hygroma/increased nuchal translucency as compared to an unselected population.  There does not appear to be an association between any specific CHD and cystic hygromas.   We discussed that the chance for a CHD increases exponentially with increasing NT measurements.  For this reason, we discussed the option of a fetal echocardiogram.     We discussed that the fetal prognosis is largely dependent upon the underlying cause of the hygroma, but that there are indications by ultrasound, such as hydrops, which significantly increase the likelihood of a poor prognosis.  She understands that cystic hygromas may worsen and progress to hydrops fetalis, improve and regress, or remain unchanged at follow up ultrasounds.  Andrea [name] was counseled that the majority of fetuses with a normal karyotype, normal detailed anatomy ultrasound, and normal fetal echocardiogram will have an uneventful outcome.  She understands that while many of the causes of a cystic hygroma can be investigated during pregnancy, not all conditions and causes can be determined prenatally.     Both family histories were reviewed and found to be contributory for congenital heart disease. The couple reported that their 78six year old daughter had a hole in the heart that was described as a heart murmur by the couple. [facility] and did not require surgical correction. It resolved on its own. She is otherwise reportedly healthy. The father of the pregnancy reported that his sister also had a heart murmur that resolved spontaneously. She is otherwise  healthy. Congenital heart defects occur in approximately 1% of pregnancies.  Congenital heart defects may occur due to multifactorial influences, chromosomal abnormalities, genetic syndromes or environmental exposures.  Isolated heart defects are generally multifactorial.  We discussed that the reported family history would be associated with an approximate 2-3% recurrence risk for congenital heart disease in the case of multifactorial inheritance. We reviewed the options of fetal echocardiogram and detailed ultrasound.   Additionally, Mr. Louisa [name] reported that his paternal first cousin has spina bifida. This relative is currently in her 8320s, has had multiple surgeries, and uses a wheelchair. She is otherwise healthy. We discussed that spina bifida is a form of open neural tube defects (ONTD) and is a relatively common birth difference, affecting approximately 1 in [id number] live births.  NTDs occur as an isolated finding, in the majority of cases.  Isolated NTDs are usually inherited in a multifactorial manner in which there is no prior family history. Both the genetic and environmental factors that contribute to the development of spina bifida are largely unknown; however, some medications and health conditions, such as uncontrolled diabetes and obesity, may increase the chance of spina bifida. We also discussed that NTDs may occur as a feature of an underlying genetic syndrome or condition.  Approximately 5-10% of individuals who have spina bifida also have an underlying chromosome condition. Given the reported family history, recurrence risk for the current pregnancy (a fourth degree relative) would not be increased above the general population risk, in the case of multifactorial inheritance. In the case of a different underlying condition, recurrence risk estimate may change. We reviewed screening options  for ONTDs available in the second trimester.   Ms. Stratton reported that her mother had a history of six  miscarriages and a son who died hours after birth. Limited information was known regarding the cause for the miscarriages or the patient's brother's death. Approximately 1 in 6 confirmed pregnancies results in miscarriage. A single underlying cause is more likely to be suspected when a couple has experienced 3 or more losses. It is less likely that there will be an identifiable single underlying cause when a couple has experienced less than 3 losses. Several possible causes including chromosome rearrangements, antibodies, and thrombophilia. We reviewed chromosomes and examples of chromosome conditions. In approximately 3-8% of couples with recurrent pregnancy loss, one partner carries a chromosome variant, such as a balanced translocation. Being a carrier of a chromosome variant can increase the risk for abnormalities in the sperm or egg cell, which can increase the risk for miscarriage or the birth of a child with birth defects and/or intellectual disability. However, the majority of cases of recurrent miscarriage would not increase risks to other relatives. The potential implications of this family history cannot be assessed without additional information.  Further genetic counseling is warranted if more information is obtained.   Mrs. Haseman was provided with written information regarding cystic fibrosis (CF) including the carrier frequency and incidence in the Caucasian population, the availability of carrier testing and prenatal diagnosis if indicated.  In addition, we discussed that CF is routinely screened for as part of the [place] newborn screening panel.  She declined testing today stating that she thinks this was performed in a previous pregnancy.    Mrs. Henrie denied exposure to environmental toxins or chemical agents. She denied the use of alcohol or street drugs. She reported smoking 8-10 cigarettes per day. The associations of smoking in pregnancy were reviewed and cessation encouraged. She denied  significant viral illnesses during the course of her pregnancy. Her medical and surgical histories were noncontributory.   I counseled this couple regarding the above risks and available options.  The approximate face-to-face time with the genetic counselor was 45 minutes.  [name], MS,  Certified Genetic Counselor 12/28/2014

## 2014-12-29 ENCOUNTER — Other Ambulatory Visit (HOSPITAL_COMMUNITY): Payer: Self-pay | Admitting: Maternal and Fetal Medicine

## 2014-12-29 DIAGNOSIS — F17210 Nicotine dependence, cigarettes, uncomplicated: Secondary | ICD-10-CM

## 2014-12-29 DIAGNOSIS — O283 Abnormal ultrasonic finding on antenatal screening of mother: Secondary | ICD-10-CM

## 2014-12-29 DIAGNOSIS — O358XX0 Maternal care for other (suspected) fetal abnormality and damage, not applicable or unspecified: Secondary | ICD-10-CM

## 2014-12-29 DIAGNOSIS — O09522 Supervision of elderly multigravida, second trimester: Secondary | ICD-10-CM

## 2014-12-29 DIAGNOSIS — IMO0001 Reserved for inherently not codable concepts without codable children: Secondary | ICD-10-CM

## 2014-12-30 ENCOUNTER — Telehealth (HOSPITAL_COMMUNITY): Payer: Self-pay | Admitting: MS"

## 2014-12-30 LAB — INFORMASEQ(SM) WITH XY ANALYSIS
Fetal Fraction (%):: 5.9
Gestational Age at Collection: 13.1 weeks

## 2014-12-30 NOTE — Telephone Encounter (Signed)
Patient called to inquire about status of her noninvasive prenatal screening results. Mrs. Andrea [name] [facility], drawn by her OB given the ultrasound finding of cystic hygroma.  The patient was identified by name and DOB.  We reviewed that these are within normal limits for Down syndrome, trisomy 4118, trisomy 6413, and monosomy X. We reviewed that this testing identifies > 99% of pregnancies with trisomy 21, 98% of pregnancies with trisomy 18/13, and 95% of pregnancies with monosomy X. Testing was also consistent with female fetal sex.  The patient did wish to know fetal sex.  She understands that this testing does not identify all genetic conditions.  All questions were answered to her satisfaction, she was encouraged to call with additional questions or concerns.  Quinn PlowmanKaren Amarionna Arca, MS Certified Genetic Counselor 12/30/2014 10:31 AM

## 2015-01-20 ENCOUNTER — Encounter: Payer: Medicaid Other | Admitting: Obstetrics & Gynecology

## 2015-01-27 ENCOUNTER — Ambulatory Visit (HOSPITAL_COMMUNITY): Admission: RE | Admit: 2015-01-27 | Payer: Medicaid Other | Source: Ambulatory Visit

## 2015-02-15 ENCOUNTER — Ambulatory Visit (INDEPENDENT_AMBULATORY_CARE_PROVIDER_SITE_OTHER): Payer: Medicaid Other | Admitting: Obstetrics & Gynecology

## 2015-02-15 ENCOUNTER — Encounter: Payer: Self-pay | Admitting: Obstetrics & Gynecology

## 2015-02-15 VITALS — BP 110/70 | HR 76 | Wt 146.4 lb

## 2015-02-15 DIAGNOSIS — IMO0001 Reserved for inherently not codable concepts without codable children: Secondary | ICD-10-CM

## 2015-02-15 DIAGNOSIS — O358XX1 Maternal care for other (suspected) fetal abnormality and damage, fetus 1: Principal | ICD-10-CM

## 2015-02-15 DIAGNOSIS — Z3492 Encounter for supervision of normal pregnancy, unspecified, second trimester: Secondary | ICD-10-CM

## 2015-02-15 DIAGNOSIS — Z1389 Encounter for screening for other disorder: Secondary | ICD-10-CM

## 2015-02-15 DIAGNOSIS — Z331 Pregnant state, incidental: Secondary | ICD-10-CM

## 2015-02-15 DIAGNOSIS — O0992 Supervision of high risk pregnancy, unspecified, second trimester: Secondary | ICD-10-CM

## 2015-02-15 LAB — POCT URINALYSIS DIPSTICK
GLUCOSE UA: NEGATIVE
Ketones, UA: NEGATIVE
Leukocytes, UA: NEGATIVE
Nitrite, UA: NEGATIVE
Protein, UA: NEGATIVE
RBC UA: NEGATIVE

## 2015-02-15 MED ORDER — OMEPRAZOLE 20 MG PO CPDR: 20.0000 mg | DELAYED_RELEASE_CAPSULE | Freq: Every day | ORAL | Status: DC

## 2015-02-15 NOTE — Progress Notes (Signed)
Fetal Surveillance Testing today:  none   High Risk Pregnancy Diagnosis(es):   Bilateral cystic nuchal hygromas(cfDNA normal, declined amniocentesis)  [id number] [redacted]w[redacted]d Estimated Date of Delivery: 06/28/15  Blood pressure 110/70, pulse 76, weight 146 lb 6.4 oz (66.407 kg), last menstrual period 08/28/2014.  Urinalysis: Negative   HPI: The patient is being seen today for ongoing management of cystic hygromas. Today she reports heartburn   BP weight and urine results all reviewed and noted. Patient reports good fetal movement, denies any bleeding and no rupture of membranes symptoms or regular contractions.  Fundal Height:  21 Fetal Heart rate:  155 Edema:  none  Patient is without complaints other than noted in her HPI. All questions were answered.  All lab and sonogram results have been reviewed. Comments: abnormal: cystic hygroma   Assessment:  1.  Pregnancy at [redacted]w[redacted]d,  Estimated Date of Delivery: 06/28/15 :                          2.  Bilateral cystic hygroma                        3.    Medication(s) Plans:  No changes  Treatment Plan:    Follow up in 1 weeks for appointment for high risk OB care,

## 2015-02-15 NOTE — Addendum Note (Signed)
Addended by: Criss Alvine on: 02/15/2015 04:17 PM   Modules accepted: Orders

## 2015-02-23 ENCOUNTER — Ambulatory Visit (INDEPENDENT_AMBULATORY_CARE_PROVIDER_SITE_OTHER): Payer: Medicaid Other

## 2015-02-23 ENCOUNTER — Encounter: Payer: Self-pay | Admitting: Obstetrics & Gynecology

## 2015-02-23 ENCOUNTER — Ambulatory Visit (INDEPENDENT_AMBULATORY_CARE_PROVIDER_SITE_OTHER): Payer: Medicaid Other | Admitting: Obstetrics & Gynecology

## 2015-02-23 VITALS — BP 100/70 | HR 76 | Wt 145.0 lb

## 2015-02-23 DIAGNOSIS — O0992 Supervision of high risk pregnancy, unspecified, second trimester: Secondary | ICD-10-CM | POA: Diagnosis not present

## 2015-02-23 DIAGNOSIS — IMO0001 Reserved for inherently not codable concepts without codable children: Secondary | ICD-10-CM

## 2015-02-23 DIAGNOSIS — Z331 Pregnant state, incidental: Secondary | ICD-10-CM

## 2015-02-23 DIAGNOSIS — O09212 Supervision of pregnancy with history of pre-term labor, second trimester: Secondary | ICD-10-CM

## 2015-02-23 DIAGNOSIS — O09892 Supervision of other high risk pregnancies, second trimester: Secondary | ICD-10-CM

## 2015-02-23 DIAGNOSIS — O358XX1 Maternal care for other (suspected) fetal abnormality and damage, fetus 1: Secondary | ICD-10-CM

## 2015-02-23 DIAGNOSIS — Z3492 Encounter for supervision of normal pregnancy, unspecified, second trimester: Secondary | ICD-10-CM

## 2015-02-23 DIAGNOSIS — Z1389 Encounter for screening for other disorder: Secondary | ICD-10-CM

## 2015-02-23 NOTE — Progress Notes (Signed)
[id number] 5414w1d Estimated Date of Delivery: 06/28/15  Blood pressure 100/70, pulse 76, weight 145 lb (65.772 kg), last menstrual period 08/28/2014.   BP weight and urine results all reviewed and noted.  Please refer to the obstetrical flow sheet for the fundal height and fetal heart rate documentation:  Patient reports good fetal movement, denies any bleeding and no rupture of membranes symptoms or regular contractions. Patient is without complaints. All questions were answered.  Plan:  Continued routine obstetrical care,   Follow up in 4 weeks for OB appointment, PN2

## 2015-02-23 NOTE — Progress Notes (Signed)
[place]ore[place] 22wks measurements c/w dates,efw 461g 34.2%,fht 152bpm,afi sdp 5.2cm,cx 3.6cm,normal ov's bilat,post pl gr 0,cystic hygroma was not visualized on today's ultrasound,anatomy scan complete w no obvious abn seen

## 2015-03-24 ENCOUNTER — Encounter: Payer: Self-pay | Admitting: Obstetrics & Gynecology

## 2015-03-24 ENCOUNTER — Other Ambulatory Visit: Payer: Medicaid Other

## 2015-03-24 ENCOUNTER — Ambulatory Visit (INDEPENDENT_AMBULATORY_CARE_PROVIDER_SITE_OTHER): Payer: Medicaid Other | Admitting: Obstetrics & Gynecology

## 2015-03-24 VITALS — BP 120/70 | HR 76 | Wt 148.4 lb

## 2015-03-24 DIAGNOSIS — Z1389 Encounter for screening for other disorder: Secondary | ICD-10-CM

## 2015-03-24 DIAGNOSIS — Z331 Pregnant state, incidental: Secondary | ICD-10-CM

## 2015-03-24 DIAGNOSIS — Z3492 Encounter for supervision of normal pregnancy, unspecified, second trimester: Secondary | ICD-10-CM

## 2015-03-24 LAB — POCT URINALYSIS DIPSTICK
Glucose, UA: NEGATIVE
Ketones, UA: NEGATIVE
Leukocytes, UA: NEGATIVE
NITRITE UA: NEGATIVE
RBC UA: NEGATIVE

## 2015-03-24 NOTE — Progress Notes (Signed)
[id number] [redacted]w[redacted]d Estimated Date of Delivery: 06/28/15  Blood pressure 120/70, pulse 76, weight 148 lb 6.4 oz (67.314 kg), last menstrual period 08/28/2014.   BP weight and urine results all reviewed and noted.  Please refer to the obstetrical flow sheet for the fundal height and fetal heart rate documentation:  Patient reports good fetal movement, denies any bleeding and no rupture of membranes symptoms or regular contractions. Patient is without complaints. All questions were answered.  Plan:  Continued routine obstetrical care,   Follow up in 1 weeks for OB appointment, PN2 Ob visit 4 weeks

## 2015-03-29 ENCOUNTER — Other Ambulatory Visit: Payer: Medicaid Other

## 2015-04-01 ENCOUNTER — Other Ambulatory Visit: Payer: Medicaid Other

## 2015-04-03 LAB — CBC
HEMATOCRIT: 30.3 % — AB (ref 34.0–46.6)
Hemoglobin: 10 g/dL — ABNORMAL LOW (ref 11.1–15.9)
MCH: 31.8 pg (ref 26.6–33.0)
MCHC: 33 g/dL (ref 31.5–35.7)
MCV: 97 fL (ref 79–97)
Platelets: 384 10*3/uL — ABNORMAL HIGH (ref 150–379)
RBC: 3.14 x10E6/uL — AB (ref 3.77–5.28)
RDW: 14.2 % (ref 12.3–15.4)
WBC: 13.3 10*3/uL — ABNORMAL HIGH (ref 3.4–10.8)

## 2015-04-03 LAB — GLUCOSE TOLERANCE, 2 HOURS W/ 1HR
GLUCOSE, 1 HOUR: 145 mg/dL (ref 65–179)
GLUCOSE, 2 HOUR: 95 mg/dL (ref 65–152)
GLUCOSE, FASTING: 74 mg/dL (ref 65–91)

## 2015-04-03 LAB — ANTIBODY SCREEN: ANTIBODY SCREEN: NEGATIVE

## 2015-04-03 LAB — HIV ANTIBODY (ROUTINE TESTING W REFLEX): HIV SCREEN 4TH GENERATION: NONREACTIVE

## 2015-04-03 LAB — HSV 2 ANTIBODY, IGG: HSV 2 Glycoprotein G Ab, IgG: <0.91 index (ref 0.00–0.90)

## 2015-04-03 LAB — RPR: RPR Ser Ql: NONREACTIVE

## 2015-04-14 ENCOUNTER — Ambulatory Visit (INDEPENDENT_AMBULATORY_CARE_PROVIDER_SITE_OTHER): Payer: Medicaid Other | Admitting: Advanced Practice Midwife

## 2015-04-14 VITALS — BP 122/70 | HR 92 | Wt 150.0 lb

## 2015-04-14 DIAGNOSIS — O09523 Supervision of elderly multigravida, third trimester: Secondary | ICD-10-CM

## 2015-04-14 DIAGNOSIS — R809 Proteinuria, unspecified: Secondary | ICD-10-CM

## 2015-04-14 DIAGNOSIS — Z1389 Encounter for screening for other disorder: Secondary | ICD-10-CM

## 2015-04-14 DIAGNOSIS — Z331 Pregnant state, incidental: Secondary | ICD-10-CM

## 2015-04-14 DIAGNOSIS — Z3493 Encounter for supervision of normal pregnancy, unspecified, third trimester: Secondary | ICD-10-CM

## 2015-04-14 LAB — POCT URINALYSIS DIPSTICK
Blood, UA: NEGATIVE
GLUCOSE UA: NEGATIVE
Ketones, UA: NEGATIVE
Leukocytes, UA: NEGATIVE
NITRITE UA: NEGATIVE
Protein, UA: 1

## 2015-04-14 MED ORDER — NITROFURANTOIN MONOHYD MACRO 100 MG PO CAPS: 100.0000 mg | ORAL_CAPSULE | Freq: Two times a day (BID) | ORAL | Status: AC

## 2015-04-14 NOTE — Progress Notes (Signed)
[id number] [redacted]w[redacted]d Estimated Date of Delivery: 06/28/15  Blood pressure 122/70, pulse 92, weight 150 lb (68.04 kg), last menstrual period 08/28/2014.   BP weight and urine results all reviewed and noted.  Please refer to the obstetrical flow sheet for the fundal height and fetal heart rate documentation: States DC has increased. SSE:  Normal mucusy discharge.  Wet prep neg.  Feels like she has a UTI, Will treat as such d/t hx.    Patient reports good fetal movement, denies any bleeding and no rupture of membranes symptoms or regular contractions. Patient is without complaints other than normal pregnancy complaints.  Back hurts when sleeping, restless leg.  All questions were answered.  Plan:  Continued routine obstetrical care, Culture urine Rx macrobid  BID. Get strategic with pillows  Follow up in 3 weeks for OB appointment, EFW/BPP.  Referral for fetal echo put in epic.

## 2015-04-15 LAB — URINE CULTURE: ORGANISM ID, BACTERIA: NO GROWTH

## 2015-04-29 ENCOUNTER — Other Ambulatory Visit: Payer: Self-pay | Admitting: Advanced Practice Midwife

## 2015-04-29 DIAGNOSIS — O09523 Supervision of elderly multigravida, third trimester: Secondary | ICD-10-CM

## 2015-05-05 ENCOUNTER — Ambulatory Visit (INDEPENDENT_AMBULATORY_CARE_PROVIDER_SITE_OTHER): Payer: Medicaid Other

## 2015-05-05 ENCOUNTER — Ambulatory Visit (INDEPENDENT_AMBULATORY_CARE_PROVIDER_SITE_OTHER): Payer: Medicaid Other | Admitting: Advanced Practice Midwife

## 2015-05-05 ENCOUNTER — Encounter: Payer: Self-pay | Admitting: Advanced Practice Midwife

## 2015-05-05 VITALS — BP 100/64 | HR 72 | Wt 153.0 lb

## 2015-05-05 DIAGNOSIS — O09523 Supervision of elderly multigravida, third trimester: Secondary | ICD-10-CM

## 2015-05-05 DIAGNOSIS — Z1389 Encounter for screening for other disorder: Secondary | ICD-10-CM

## 2015-05-05 DIAGNOSIS — Z331 Pregnant state, incidental: Secondary | ICD-10-CM

## 2015-05-05 DIAGNOSIS — Z3493 Encounter for supervision of normal pregnancy, unspecified, third trimester: Secondary | ICD-10-CM

## 2015-05-05 LAB — POCT URINALYSIS DIPSTICK
Blood, UA: NEGATIVE
Glucose, UA: NEGATIVE
Glucose, UA: NEGATIVE
Ketones, UA: NEGATIVE
LEUKOCYTES UA: NEGATIVE
Nitrite, UA: NEGATIVE
PROTEIN UA: NEGATIVE
Protein, UA: NEGATIVE

## 2015-05-05 NOTE — Progress Notes (Signed)
[id number] [redacted]w[redacted]d Estimated Date of Delivery: 06/28/15  Last menstrual period 08/28/2014.   BP weight and urine results all reviewed and noted.  Please refer to the obstetrical flow sheet for the fundal height and fetal heart rate documentation: [place] today for AMA:  [place] 32+2wks,cephalic,fht 148 bpm,bilat adnexa's wnl,post pl gr 3,BPP 8/8,afi 11.3cm,EFW 1840g 38.4%  Patient reports good fetal movement, denies any bleeding and no rupture of membranes symptoms or regular contractions. Patient is without complaints. All questions were answered.  No orders of the defined types were placed in this encounter.    Plan:  Continued routine obstetrical care, fetal echo scheduled for 9/29 at 0900 at [facility] per recommendation of MFM d/t nuchal hygromas  Return in about 2 weeks (around 05/19/2015) for LROB.

## 2015-05-05 NOTE — Progress Notes (Signed)
[place] 32+2wks,cephalic,fht 148 bpm,bilat adnexa's wnl,post pl gr 3,BPP 8/8,afi 11.3cm,EFW 1840g 38.4%

## 2015-05-05 NOTE — Patient Instructions (Addendum)
Your fetal echocardiogram (ultrasound to look at the baby's heart) is scheduled with [facility] for 9/29 at 9 am at [address], [place], [place].  [facility]). If this is not convenient, you may call to reschedule [phone]

## 2015-05-05 NOTE — Progress Notes (Signed)
Pt denies any problems or concerns at this time.  

## 2015-05-10 ENCOUNTER — Telehealth: Payer: Self-pay | Admitting: Advanced Practice Midwife

## 2015-05-10 NOTE — Telephone Encounter (Signed)
Pt states she has had diarrhea for past two days, has also had nausea and lower back pain.  Pt reports good FM, no sudden gush of fluids and contractions off and on, nothing regular.  Advised pt to take tylenol for the back pain and she can tlke Kaopectate or Imodium A-D for the diarrhea, push fluids and rest when can.  If symptoms worsen or dont improve over next couple of days call us back and we will get her an appointment to be seen.  Pt verbalized understanding.

## 2015-05-18 ENCOUNTER — Encounter: Payer: Self-pay | Admitting: Advanced Practice Midwife

## 2015-05-18 ENCOUNTER — Ambulatory Visit (INDEPENDENT_AMBULATORY_CARE_PROVIDER_SITE_OTHER): Payer: Medicaid Other | Admitting: Advanced Practice Midwife

## 2015-05-18 VITALS — BP 130/80 | HR 76 | Temp 98.5°F | Wt 154.0 lb

## 2015-05-18 DIAGNOSIS — Z331 Pregnant state, incidental: Secondary | ICD-10-CM

## 2015-05-18 DIAGNOSIS — Z1389 Encounter for screening for other disorder: Secondary | ICD-10-CM

## 2015-05-18 DIAGNOSIS — O368131 Decreased fetal movements, third trimester, fetus 1: Secondary | ICD-10-CM | POA: Diagnosis not present

## 2015-05-18 LAB — POCT URINALYSIS DIPSTICK
Glucose, UA: NEGATIVE
Ketones, UA: NEGATIVE
Leukocytes, UA: NEGATIVE
NITRITE UA: NEGATIVE
PROTEIN UA: NEGATIVE
RBC UA: NEGATIVE

## 2015-05-18 MED ORDER — CYCLOBENZAPRINE HCL 10 MG PO TABS: 10.0000 mg | ORAL_TABLET | Freq: Three times a day (TID) | ORAL | Status: DC | PRN

## 2015-05-18 NOTE — Patient Instructions (Signed)

## 2015-05-18 NOTE — Progress Notes (Signed)
[id number] [redacted]w[redacted]d Estimated Date of Delivery: 06/28/15  Blood pressure 130/80, pulse 76, temperature 98.5 F (36.9 C), weight 154 lb (69.854 kg), last menstrual period 08/28/2014.   BP weight and urine results all reviewed and noted.  Please refer to the obstetrical flow sheet for the fundal height and fetal heart rate documentation:  Patient reports good fetal movement, denies any bleeding and no rupture of membranes symptoms or regular contractions. Pt c/o:  Decreased fetal movement:  NST reactive Congestion for a week or so.  Feels like its allergies, which she often has.  Try zyrtec or claritan.  Consider ABX if no response. Lungs CTAB, normal resp effort, non productive cough.  Lower back pain with subsequent trouble sleeping. :  Musculoskeletal.  Tips given and rx flexeril  TID prn. May also try unisom. May consider ambien All questions were answered.  Orders Placed This Encounter  Procedures  . POCT urinalysis dipstick    Plan:  Continued routine obstetrical care,   Return in about 2 weeks (around 06/01/2015) for LROB.

## 2015-05-19 ENCOUNTER — Encounter: Payer: Medicaid Other | Admitting: Advanced Practice Midwife

## 2015-06-01 ENCOUNTER — Ambulatory Visit (INDEPENDENT_AMBULATORY_CARE_PROVIDER_SITE_OTHER): Payer: Medicaid Other | Admitting: Advanced Practice Midwife

## 2015-06-01 ENCOUNTER — Encounter: Payer: Self-pay | Admitting: Advanced Practice Midwife

## 2015-06-01 VITALS — BP 130/80 | HR 76 | Temp 98.7°F | Wt 155.4 lb

## 2015-06-01 DIAGNOSIS — Z3493 Encounter for supervision of normal pregnancy, unspecified, third trimester: Secondary | ICD-10-CM

## 2015-06-01 DIAGNOSIS — Z1389 Encounter for screening for other disorder: Secondary | ICD-10-CM

## 2015-06-01 DIAGNOSIS — Z331 Pregnant state, incidental: Secondary | ICD-10-CM

## 2015-06-01 LAB — POCT URINALYSIS DIPSTICK
Blood, UA: NEGATIVE
Glucose, UA: NEGATIVE
KETONES UA: NEGATIVE
Leukocytes, UA: NEGATIVE
Nitrite, UA: NEGATIVE
PROTEIN UA: NEGATIVE

## 2015-06-01 MED ORDER — AZITHROMYCIN 250 MG PO TABS: 250.0000 mg | ORAL_TABLET | Freq: Every day | ORAL | Status: DC

## 2015-06-01 NOTE — Progress Notes (Signed)
[id number] [redacted]w[redacted]d Estimated Date of Delivery: 06/28/15  Blood pressure 130/80, pulse 76, temperature 98.7 F (37.1 C), weight 155 lb 6.4 oz (70.489 kg), last menstrual period 08/28/2014.   BP weight and urine results all reviewed and noted.  Please refer to the obstetrical flow sheet for the fundal height and fetal heart rate documentation:  Patient reports good fetal movement, denies any bleeding and no rupture of membranes symptoms or regular contractions. Patient still stuffy, coughing/congested.  Had Echo 9/29 (can't find any notes in Epic--says MD wants echo post delivery. PUt in sticky note.) All questions were answered.  Orders Placed This Encounter  Procedures  . POCT urinalysis dipstick    Plan:  Continued routine obstetrical care, z pack   Return for LROB.

## 2015-06-07 ENCOUNTER — Telehealth: Payer: Self-pay | Admitting: *Deleted

## 2015-06-07 NOTE — Telephone Encounter (Signed)
Spoke with pt. Pt has had vomiting and diarrhea x 2 and a half days. She just finished a Zpack Sunday for bronchitis. She is having braxton hicks contractions, not consistent. No bleeding. Pt don't feel a lot of movement during the day, but can count at least 10 kicks over 2 hours at night. Baby has always been more active at night per pt. Pt feels dizzy at times. + mouth moist, urinating ok. Pt still coughing a lot and thinks she may be urinating on self. Has a scheduled appt Thursday. I advised to try Imodium for diarrhea. Pt wants to hold off on anything for vomiting at this time. I advised to watch for signs of dehydration, dry mouth, decrease in urinating. Watch for decrease in baby movement. Keep appt for Thursday and call us if anything changes. Pt voiced understanding. JSY

## 2015-06-09 ENCOUNTER — Encounter: Payer: Self-pay | Admitting: Advanced Practice Midwife

## 2015-06-09 ENCOUNTER — Ambulatory Visit (INDEPENDENT_AMBULATORY_CARE_PROVIDER_SITE_OTHER): Payer: Medicaid Other | Admitting: Advanced Practice Midwife

## 2015-06-09 VITALS — BP 122/72 | HR 80 | Wt 151.0 lb

## 2015-06-09 DIAGNOSIS — Z1389 Encounter for screening for other disorder: Secondary | ICD-10-CM

## 2015-06-09 DIAGNOSIS — R809 Proteinuria, unspecified: Secondary | ICD-10-CM

## 2015-06-09 DIAGNOSIS — Z3493 Encounter for supervision of normal pregnancy, unspecified, third trimester: Secondary | ICD-10-CM

## 2015-06-09 DIAGNOSIS — Z331 Pregnant state, incidental: Secondary | ICD-10-CM

## 2015-06-09 DIAGNOSIS — O26843 Uterine size-date discrepancy, third trimester: Secondary | ICD-10-CM

## 2015-06-09 DIAGNOSIS — Z369 Encounter for antenatal screening, unspecified: Secondary | ICD-10-CM

## 2015-06-09 LAB — POCT URINALYSIS DIPSTICK
Glucose, UA: NEGATIVE
Leukocytes, UA: NEGATIVE
Nitrite, UA: NEGATIVE
Protein, UA: 1

## 2015-06-09 LAB — OB RESULTS CONSOLE GBS: STREP GROUP B AG: NEGATIVE

## 2015-06-09 LAB — OB RESULTS CONSOLE GC/CHLAMYDIA
Chlamydia: NEGATIVE
Gonorrhea: NEGATIVE

## 2015-06-09 MED ORDER — OMEPRAZOLE 20 MG PO CPDR: 20.0000 mg | DELAYED_RELEASE_CAPSULE | Freq: Every day | ORAL | Status: DC

## 2015-06-09 NOTE — Addendum Note (Signed)
Addended by: Jacklyn ShellRESENZO-DISHMON, Cordale Manera on: 06/09/2015 10:42 AM   Modules accepted: Orders

## 2015-06-09 NOTE — Addendum Note (Signed)
Addended by: Richardson ChiquitoRAVIS, ASHLEY M on: 06/09/2015 10:42 AM   Modules accepted: Orders

## 2015-06-09 NOTE — Progress Notes (Signed)
[id number] 3431w2d Estimated Date of Delivery: 06/28/15  Blood pressure 122/72, pulse 80, weight 151 lb (68.493 kg), last menstrual period 08/28/2014.   BP weight and urine results all reviewed and noted. Denies UTI sx  Please refer to the obstetrical flow sheet for the fundal height and fetal heart rate documentation:size < dates  Patient reports good fetal movement, denies any bleeding and no rupture of membranes symptoms or regular contractions. Patient feels dizzy, even when supine. Sounds like vertigo. Declines meds.  Has been vomiting after meals (no nausea).  Had stopped reflux meds.  C/O vaginal pain/pressure. SSE:  DC c/w yeast. Neg pooling, valsalva.   All questions were answered.  Orders Placed This Encounter  Procedures  . Urine culture  . US OB Follow Up  . POCT urinalysis dipstick    Plan:  Continued routine obstetrical care, restart prilosec. OTC yeast meds. Culture urine d/t proteinuria  Return in about 1 week (around 06/16/2015) for LROB, US:  EFW/AFI.

## 2015-06-09 NOTE — Progress Notes (Signed)
Pt states that when she wiped here there was a little blood tinge to it. Pt states that she can't keep anything down and has been dizzy since Monday.

## 2015-06-09 NOTE — Patient Instructions (Signed)

## 2015-06-11 LAB — GC/CHLAMYDIA PROBE AMP
Chlamydia trachomatis, NAA: NEGATIVE
NEISSERIA GONORRHOEAE BY PCR: NEGATIVE

## 2015-06-11 LAB — URINE CULTURE

## 2015-06-11 LAB — STREP GP B NAA+RFLX: Strep Gp B NAA+Rflx: NEGATIVE

## 2015-06-16 ENCOUNTER — Other Ambulatory Visit: Payer: Self-pay | Admitting: Advanced Practice Midwife

## 2015-06-16 ENCOUNTER — Encounter: Payer: Self-pay | Admitting: Advanced Practice Midwife

## 2015-06-16 ENCOUNTER — Telehealth (HOSPITAL_COMMUNITY): Payer: Self-pay | Admitting: *Deleted

## 2015-06-16 ENCOUNTER — Ambulatory Visit (INDEPENDENT_AMBULATORY_CARE_PROVIDER_SITE_OTHER): Payer: Medicaid Other | Admitting: Advanced Practice Midwife

## 2015-06-16 ENCOUNTER — Inpatient Hospital Stay (HOSPITAL_COMMUNITY)
Admission: AD | Admit: 2015-06-16 | Discharge: 2015-06-19 | DRG: 775 | Disposition: A | Discharge status: Home / Self Care | Payer: Medicaid Other | Source: Ambulatory Visit | Attending: Obstetrics & Gynecology | Admitting: Obstetrics & Gynecology

## 2015-06-16 ENCOUNTER — Ambulatory Visit (INDEPENDENT_AMBULATORY_CARE_PROVIDER_SITE_OTHER): Payer: Medicaid Other

## 2015-06-16 ENCOUNTER — Encounter (HOSPITAL_COMMUNITY): Payer: Self-pay | Admitting: *Deleted

## 2015-06-16 ENCOUNTER — Encounter (HOSPITAL_COMMUNITY): Payer: Self-pay

## 2015-06-16 VITALS — BP 140/80 | HR 80 | Wt 158.0 lb

## 2015-06-16 DIAGNOSIS — O09213 Supervision of pregnancy with history of pre-term labor, third trimester: Secondary | ICD-10-CM

## 2015-06-16 DIAGNOSIS — O09893 Supervision of other high risk pregnancies, third trimester: Secondary | ICD-10-CM

## 2015-06-16 DIAGNOSIS — Z3A38 38 weeks gestation of pregnancy: Secondary | ICD-10-CM

## 2015-06-16 DIAGNOSIS — O26843 Uterine size-date discrepancy, third trimester: Secondary | ICD-10-CM | POA: Diagnosis not present

## 2015-06-16 DIAGNOSIS — O09523 Supervision of elderly multigravida, third trimester: Secondary | ICD-10-CM | POA: Diagnosis not present

## 2015-06-16 DIAGNOSIS — O99613 Diseases of the digestive system complicating pregnancy, third trimester: Secondary | ICD-10-CM | POA: Diagnosis present

## 2015-06-16 DIAGNOSIS — Z3493 Encounter for supervision of normal pregnancy, unspecified, third trimester: Secondary | ICD-10-CM

## 2015-06-16 DIAGNOSIS — O134 Gestational [pregnancy-induced] hypertension without significant proteinuria, complicating childbirth: Secondary | ICD-10-CM | POA: Diagnosis present

## 2015-06-16 DIAGNOSIS — O365930 Maternal care for other known or suspected poor fetal growth, third trimester, not applicable or unspecified: Principal | ICD-10-CM | POA: Diagnosis present

## 2015-06-16 DIAGNOSIS — K219 Gastro-esophageal reflux disease without esophagitis: Secondary | ICD-10-CM | POA: Diagnosis present

## 2015-06-16 DIAGNOSIS — O139 Gestational [pregnancy-induced] hypertension without significant proteinuria, unspecified trimester: Secondary | ICD-10-CM

## 2015-06-16 DIAGNOSIS — Z331 Pregnant state, incidental: Secondary | ICD-10-CM

## 2015-06-16 DIAGNOSIS — Z1389 Encounter for screening for other disorder: Secondary | ICD-10-CM

## 2015-06-16 DIAGNOSIS — IMO0002 Reserved for concepts with insufficient information to code with codable children: Secondary | ICD-10-CM | POA: Diagnosis present

## 2015-06-16 LAB — COMPREHENSIVE METABOLIC PANEL
ALT: 11 U/L — AB (ref 14–54)
AST: 19 U/L (ref 15–41)
Albumin: 2.5 g/dL — ABNORMAL LOW (ref 3.5–5.0)
Alkaline Phosphatase: 191 U/L — ABNORMAL HIGH (ref 38–126)
Anion gap: 7 (ref 5–15)
CHLORIDE: 111 mmol/L (ref 101–111)
CO2: 20 mmol/L — AB (ref 22–32)
CREATININE: 0.56 mg/dL (ref 0.44–1.00)
Calcium: 8.4 mg/dL — ABNORMAL LOW (ref 8.9–10.3)
GFR calc Af Amer: >60 mL/min (ref >60)
GFR calc non Af Amer: >60 mL/min (ref >60)
Glucose, Bld: 92 mg/dL (ref 65–99)
Potassium: 3.5 mmol/L (ref 3.5–5.1)
SODIUM: 138 mmol/L (ref 135–145)
Total Bilirubin: 0.4 mg/dL (ref 0.3–1.2)
Total Protein: 6.1 g/dL — ABNORMAL LOW (ref 6.5–8.1)

## 2015-06-16 LAB — POCT URINALYSIS DIPSTICK
Blood, UA: NEGATIVE
GLUCOSE UA: NEGATIVE
KETONES UA: NEGATIVE
LEUKOCYTES UA: NEGATIVE
NITRITE UA: NEGATIVE
Protein, UA: NEGATIVE

## 2015-06-16 LAB — CBC
HEMATOCRIT: 26.4 % — AB (ref 36.0–46.0)
HEMOGLOBIN: 8.8 g/dL — AB (ref 12.0–15.0)
MCH: 30.9 pg (ref 26.0–34.0)
MCHC: 33.3 g/dL (ref 30.0–36.0)
MCV: 92.6 fL (ref 78.0–100.0)
Platelets: 510 10*3/uL — ABNORMAL HIGH (ref 150–400)
RBC: 2.85 MIL/uL — ABNORMAL LOW (ref 3.87–5.11)
RDW: 14.2 % (ref 11.5–15.5)
WBC: 15.7 10*3/uL — ABNORMAL HIGH (ref 4.0–10.5)

## 2015-06-16 LAB — PROTEIN / CREATININE RATIO, URINE
Creatinine, Urine: 85 mg/dL
PROTEIN CREATININE RATIO: 0.25 mg/mg{creat} — AB (ref 0.00–0.15)
Total Protein, Urine: 21 mg/dL

## 2015-06-16 MED ORDER — OXYCODONE-ACETAMINOPHEN 5-325 MG PO TABS: 2.0000 | ORAL_TABLET | ORAL | Status: DC | PRN

## 2015-06-16 MED ORDER — FLEET ENEMA 7-19 GM/118ML RE ENEM: 1.0000 | ENEMA | RECTAL | Status: DC | PRN

## 2015-06-16 MED ORDER — OXYTOCIN 40 UNITS IN LACTATED RINGERS INFUSION - SIMPLE MED
1.0000 m[IU]/min | INTRAVENOUS | Status: DC
  Administered 2015-06-17: 2 m[IU]/min via INTRAVENOUS
  Administered 2015-06-17: 14 m[IU]/min via INTRAVENOUS
  Administered 2015-06-17: 18 m[IU]/min via INTRAVENOUS
  Administered 2015-06-17: 20 m[IU]/min via INTRAVENOUS
  Administered 2015-06-17: 16 m[IU]/min via INTRAVENOUS
  Administered 2015-06-17: 22 m[IU]/min via INTRAVENOUS
  Administered 2015-06-17: 12 m[IU]/min via INTRAVENOUS
  Filled 2015-06-16: qty 1000

## 2015-06-16 MED ORDER — FENTANYL CITRATE (PF) 100 MCG/2ML IJ SOLN: 50.0000 ug | INTRAMUSCULAR | Status: DC | PRN

## 2015-06-16 MED ORDER — LIDOCAINE HCL (PF) 1 % IJ SOLN
30.0000 mL | INTRAMUSCULAR | Status: DC | PRN
  Filled 2015-06-16: qty 30

## 2015-06-16 MED ORDER — TERBUTALINE SULFATE 1 MG/ML IJ SOLN
0.2500 mg | Freq: Once | INTRAMUSCULAR | Status: DC | PRN
  Filled 2015-06-16: qty 1

## 2015-06-16 MED ORDER — OXYCODONE-ACETAMINOPHEN 5-325 MG PO TABS: 1.0000 | ORAL_TABLET | ORAL | Status: DC | PRN

## 2015-06-16 MED ORDER — OXYTOCIN BOLUS FROM INFUSION: 500.0000 mL | INTRAVENOUS | Status: DC

## 2015-06-16 MED ORDER — LABETALOL HCL 200 MG PO TABS
200.0000 mg | ORAL_TABLET | Freq: Two times a day (BID) | ORAL | Status: DC
  Administered 2015-06-16: 200 mg via ORAL
  Filled 2015-06-16 (×4): qty 1

## 2015-06-16 MED ORDER — CITRIC ACID-SODIUM CITRATE 334-500 MG/5ML PO SOLN: 30.0000 mL | ORAL | Status: DC | PRN

## 2015-06-16 MED ORDER — LACTATED RINGERS IV SOLN
INTRAVENOUS | Status: DC
  Administered 2015-06-16 – 2015-06-17 (×2): via INTRAVENOUS

## 2015-06-16 MED ORDER — ACETAMINOPHEN 325 MG PO TABS: 650.0000 mg | ORAL_TABLET | ORAL | Status: DC | PRN

## 2015-06-16 MED ORDER — LABETALOL HCL 5 MG/ML IV SOLN: 20.0000 mg | INTRAVENOUS | Status: DC | PRN

## 2015-06-16 MED ORDER — OXYTOCIN 40 UNITS IN LACTATED RINGERS INFUSION - SIMPLE MED
62.5000 mL/h | INTRAVENOUS | Status: DC
  Administered 2015-06-17: 62.5 mL/h via INTRAVENOUS

## 2015-06-16 MED ORDER — ZOLPIDEM TARTRATE 5 MG PO TABS: 5.0000 mg | ORAL_TABLET | Freq: Every evening | ORAL | Status: DC | PRN

## 2015-06-16 MED ORDER — LACTATED RINGERS IV SOLN: 500.0000 mL | INTRAVENOUS | Status: DC | PRN

## 2015-06-16 MED ORDER — ONDANSETRON HCL 4 MG/2ML IJ SOLN: 4.0000 mg | Freq: Four times a day (QID) | INTRAMUSCULAR | Status: DC | PRN

## 2015-06-16 NOTE — H&P (Signed)
Emilia BeckJoanna L Crum is a 34 y.o. female [phone] with IUP at 4552w2d presenting for IOL for IUGR <3%. . US today for size<dates:  Single, active female fetus in a cephalic presentation. Posterior Gr 2 placenta. Fluid is normal with AFI 11.25 cm and SVP 3.75 cm. EFW today of 2436 g which is <3%. Growth restriction is symmetric. BPP 8/8. UAD normal for this GA  [facility]  Prenatal History/Complications:  bil cystic hygromas at 14 weeks, not seen since.  Normal NIPS.  Echo normal but no visualization of aortic arch, so repeat neonatal echo (day of DC if possible to allow for closing of DA) recommended.   Past Medical History: Past Medical History  Diagnosis Date  . Pregnant 11/17/2014  . Vaginal spotting 11/17/2014  . LLQ pain 11/17/2014  . UTI (lower urinary tract infection) 11/17/2014  . AMA (advanced maternal age) multigravida 35+ 11/17/2014    Past Surgical History: Past Surgical History  Procedure Laterality Date  . Ectopic pregnancy surgery Right 2006    Obstetrical History: OB History    Gravida Para Term Preterm AB TAB SAB Ectopic Multiple Living   4 2 1 1 1   1  2       Social History: Social History   Social History  . Marital Status: Married    Spouse Name: N/A  . Number of Children: N/A  . Years of Education: N/A   Social History Main Topics  . Smoking status: Current Every Day Smoker -- 0.25 packs/day for 18 years    Types: Cigarettes  . Smokeless tobacco: Never Used  . Alcohol Use: No  . Drug Use: No  . Sexual Activity: Yes    Birth Control/ Protection: None   Other Topics Concern  . Not on file   Social History Narrative    Family History: Family History  Problem Relation Age of Onset  . Diabetes Mother   . Hypertension Mother   . Other Mother     heart bypass  . Congestive Heart Failure Mother   . Diabetes Father   . Hypertension Father   . Other Father     heart bypass  . Other Daughter     Alpha Gale Syndrome  . Cancer  Maternal Grandmother     Allergies: Allergies  Allergen Reactions  . Sulfa Antibiotics Anaphylaxis and Swelling    B/P:  140/80, had a few 150's/90's upon arrival  Prenatal Transfer Tool  Maternal Diabetes: No Genetic Screening: Normal Maternal Ultrasounds/Referrals: Abnormal:  Findings:   IUGR Fetal Ultrasounds or other Referrals:  Fetal echo, Referred to Materal Fetal Medicine  Maternal Substance Abuse:  No Significant Maternal Medications:  None Significant Maternal Lab Results: Lab values include: Group B Strep negative     Review of Systems   Constitutional: Negative for fever and chills.  Very nervous about IOL  Eyes: Negative for visual disturbances Respiratory: Negative for shortness of breath, dyspnea Cardiovascular: Negative for chest pain or palpitations  Gastrointestinal: Negative for vomiting, diarrhea and constipation.  POSITIVE for abdominal pain (contractions) Genitourinary: Negative for dysuria and urgency Musculoskeletal: Negative for back pain, joint pain, myalgias  Neurological: Negative for dizziness and headaches      Last menstrual period 08/28/2014. General appearance: alert, cooperative and no distress Lungs: clear to auscultation bilaterally Heart: regular rate and rhythm Abdomen: soft, non-tender; bowel sounds normal Pelvic: 2/50/-2 Extremities: Homans sign is negative, no sign of DVT DTR's 2+ Presentation: cephalic Fetal monitoring  Baseline: 150  bpm, Variability: Good {> 6 bpm), Accelerations: Reactive and Decelerations: Absent Uterine activity  rare   .   Prenatal labs: ABO, Rh: O/--/-- (04/05 1103) Antibody: Negative (08/05 0912) Rubella:  immune RPR: Non Reactive (08/05 0912)  HBsAg: Negative (04/05 1103)  HIV: Non-reactive (04/05 0000) neg  [facility]  Initiated Care at  10wks  FOB Tommas Olp, 33yo, 4th baby, married  Dating By 8wk u/s  Pap 2015 at [facility] neg per pt  GC/CT Initial: -/- 36+wks:-/-   Genetic Screen CfDNA: normal  CF screen neg  Anatomic [place] bil cystic hygromas 14 wks; Normal at 20 weeks. Echo normal, but couldn't see aortic arch, so echo recommended in hospital close to dc (see note in care everywhere)  Flu vaccine declined  Tdap Recommended ~ 28wks  Glucose Screen  2 hr 74/145/95  GBS neg  Feed Preference bottle  Contraception Salpingectomy outpt  Circumcision n/a  Childbirth Classes   [facility]      Results for orders placed or performed in visit on 06/16/15 (from the past 24 hour(s))  POCT urinalysis dipstick   Collection Time: 06/16/15 12:10 PM  Result Value Ref Range   Color, UA     Clarity, UA     Glucose, UA neg    Bilirubin, UA     Ketones, UA neg    Spec Grav, UA     Blood, UA neg    pH, UA     Protein, UA neg    Urobilinogen, UA     Nitrite, UA neg    Leukocytes, UA Negative Negative    Assessment: LAWONDA PRETLOW is a 34 y.o. [phone] with an IUP at [redacted]w[redacted]d presenting for IOL for IUGR <3% Borderline BP ? Nerves vs GHTN Plan: Check CMP and PR/CR ratio #Labor: foley>pitocin #Pain:  epidural #FWB cat 1 #ID: GBS: n/a    CRESENZO-DISHMAN,Martie Muhlbauer 06/16/2015, 9:29 PM

## 2015-06-16 NOTE — Telephone Encounter (Signed)
Preadmission screen  

## 2015-06-16 NOTE — Progress Notes (Signed)
[id number] 4341w2d Estimated Date of Delivery: 06/28/15  Blood pressure 140/80, pulse 80, weight 158 lb (71.668 kg), last menstrual period 08/28/2014.   BP weight and urine results all reviewed and noted.urine culture last week normal  Please refer to the obstetrical flow sheet for the fundal height and fetal heart rate documentation: US today for size<dates:  Single, active female fetus in a cephalic presentation. Posterior Gr 2 placenta. Fluid is normal with AFI 11.25 cm and SVP 3.75 cm. EFW today of 2436 g which is <3%. Growth restriction is symmetric. BPP 8/8. UAD normal for this GA.   Patient reports good fetal movement, denies any bleeding and no rupture of membranes symptoms or regular contractions. Patient is without complaints. All questions were answered.  Orders Placed This Encounter  Procedures  . POCT urinalysis dipstick    Plan:  IOL tonight for IUGR <3% Return in about 6 weeks (around 07/28/2015) for postpartum check up.

## 2015-06-16 NOTE — Progress Notes (Signed)
US 38+2wks,cephalic,post pl gr 3,fhr 145 bpm,bilat adnexa's wnl,EFW 2436 g,<10% [place],<3% [place] .57,.55,AFI 11.3cm,BPP 8/8

## 2015-06-16 NOTE — Patient Instructions (Signed)
Come to Maternity Admissions Unit tonight at 8pm  ([address] [place] to start [place]!  Eat a light meal before you come.  Daisey MustGood Luck!!

## 2015-06-16 NOTE — Progress Notes (Signed)
Filed Vitals:   06/16/15 2045 06/16/15 2106 06/16/15 2236 06/16/15 2238  BP: 157/93 155/82 160/93 155/74  Pulse: 108 95 97 84  Temp:      TempSrc:      Resp: 18 18 18 18   Height:      Weight:       Urine dip neg protein, pr/cr ratio pending.  LFT's not elevated, platelets not low.  No HA, RUQ pain, vision changes.   A:  New onset GHTN, no signs of preeclampsia P: Will give PO labetalol and treat any severe range BP's with IV

## 2015-06-17 ENCOUNTER — Inpatient Hospital Stay (HOSPITAL_COMMUNITY): Payer: Medicaid Other | Admitting: Anesthesiology

## 2015-06-17 ENCOUNTER — Inpatient Hospital Stay (HOSPITAL_COMMUNITY): Admission: RE | Admit: 2015-06-17 | Payer: Medicaid Other | Source: Ambulatory Visit

## 2015-06-17 ENCOUNTER — Encounter (HOSPITAL_COMMUNITY): Payer: Self-pay

## 2015-06-17 DIAGNOSIS — K219 Gastro-esophageal reflux disease without esophagitis: Secondary | ICD-10-CM

## 2015-06-17 DIAGNOSIS — O134 Gestational [pregnancy-induced] hypertension without significant proteinuria, complicating childbirth: Secondary | ICD-10-CM

## 2015-06-17 DIAGNOSIS — O365930 Maternal care for other known or suspected poor fetal growth, third trimester, not applicable or unspecified: Secondary | ICD-10-CM

## 2015-06-17 DIAGNOSIS — O99613 Diseases of the digestive system complicating pregnancy, third trimester: Secondary | ICD-10-CM

## 2015-06-17 DIAGNOSIS — Z3A38 38 weeks gestation of pregnancy: Secondary | ICD-10-CM

## 2015-06-17 LAB — CBC
HEMATOCRIT: 23.8 % — AB (ref 36.0–46.0)
HEMATOCRIT: 24.8 % — AB (ref 36.0–46.0)
Hemoglobin: 8 g/dL — ABNORMAL LOW (ref 12.0–15.0)
Hemoglobin: 8.3 g/dL — ABNORMAL LOW (ref 12.0–15.0)
MCH: 30.9 pg (ref 26.0–34.0)
MCH: 30.9 pg (ref 26.0–34.0)
MCHC: 33.6 g/dL (ref 30.0–36.0)
MCHC: 33.5 g/dL (ref 30.0–36.0)
MCV: 91.9 fL (ref 78.0–100.0)
MCV: 92.2 fL (ref 78.0–100.0)
PLATELETS: 381 10*3/uL (ref 150–400)
Platelets: 362 10*3/uL (ref 150–400)
RBC: 2.59 MIL/uL — AB (ref 3.87–5.11)
RBC: 2.69 MIL/uL — AB (ref 3.87–5.11)
RDW: 14.5 % (ref 11.5–15.5)
RDW: 14.6 % (ref 11.5–15.5)
WBC: 13.4 10*3/uL — AB (ref 4.0–10.5)
WBC: 16.7 10*3/uL — AB (ref 4.0–10.5)

## 2015-06-17 LAB — RPR: RPR Ser Ql: NONREACTIVE

## 2015-06-17 LAB — TYPE AND SCREEN
ABO/RH(D): O POS
Antibody Screen: NEGATIVE

## 2015-06-17 LAB — ABO/RH: ABO/RH(D): O POS

## 2015-06-17 MED ORDER — SENNOSIDES-DOCUSATE SODIUM 8.6-50 MG PO TABS
2.0000 | ORAL_TABLET | ORAL | Status: DC
  Administered 2015-06-17: 2 via ORAL
  Filled 2015-06-17 (×2): qty 2

## 2015-06-17 MED ORDER — OXYCODONE-ACETAMINOPHEN 5-325 MG PO TABS: 2.0000 | ORAL_TABLET | ORAL | Status: DC | PRN

## 2015-06-17 MED ORDER — SIMETHICONE 80 MG PO CHEW: 80.0000 mg | CHEWABLE_TABLET | ORAL | Status: DC | PRN

## 2015-06-17 MED ORDER — BENZOCAINE-MENTHOL 20-0.5 % EX AERO: 1.0000 "application " | INHALATION_SPRAY | CUTANEOUS | Status: DC | PRN

## 2015-06-17 MED ORDER — OXYCODONE-ACETAMINOPHEN 5-325 MG PO TABS: 1.0000 | ORAL_TABLET | ORAL | Status: DC | PRN

## 2015-06-17 MED ORDER — ACETAMINOPHEN 325 MG PO TABS
650.0000 mg | ORAL_TABLET | ORAL | Status: DC | PRN
Start: 2015-06-17 — End: 2015-06-19

## 2015-06-17 MED ORDER — LANOLIN HYDROUS EX OINT: TOPICAL_OINTMENT | CUTANEOUS | Status: DC | PRN

## 2015-06-17 MED ORDER — TETANUS-DIPHTH-ACELL PERTUSSIS 5-2.5-18.5 LF-MCG/0.5 IM SUSP: 0.5000 mL | Freq: Once | INTRAMUSCULAR | Status: DC

## 2015-06-17 MED ORDER — IBUPROFEN 600 MG PO TABS
600.0000 mg | ORAL_TABLET | Freq: Four times a day (QID) | ORAL | Status: DC
  Administered 2015-06-17 – 2015-06-19 (×7): 600 mg via ORAL
  Filled 2015-06-17 (×7): qty 1

## 2015-06-17 MED ORDER — EPHEDRINE 5 MG/ML INJ
10.0000 mg | INTRAVENOUS | Status: DC | PRN
  Filled 2015-06-17: qty 2

## 2015-06-17 MED ORDER — DIPHENHYDRAMINE HCL 50 MG/ML IJ SOLN: 12.5000 mg | INTRAMUSCULAR | Status: DC | PRN

## 2015-06-17 MED ORDER — LABETALOL HCL 200 MG PO TABS
200.0000 mg | ORAL_TABLET | Freq: Two times a day (BID) | ORAL | Status: DC
  Administered 2015-06-17 – 2015-06-18 (×3): 200 mg via ORAL
  Filled 2015-06-17 (×3): qty 1

## 2015-06-17 MED ORDER — DIPHENHYDRAMINE HCL 25 MG PO CAPS: 25.0000 mg | ORAL_CAPSULE | Freq: Four times a day (QID) | ORAL | Status: DC | PRN

## 2015-06-17 MED ORDER — PHENYLEPHRINE 40 MCG/ML (10ML) SYRINGE FOR IV PUSH (FOR BLOOD PRESSURE SUPPORT)
80.0000 ug | PREFILLED_SYRINGE | INTRAVENOUS | Status: DC | PRN
  Filled 2015-06-17: qty 20
  Filled 2015-06-17: qty 2

## 2015-06-17 MED ORDER — ONDANSETRON HCL 4 MG PO TABS: 4.0000 mg | ORAL_TABLET | ORAL | Status: DC | PRN

## 2015-06-17 MED ORDER — CYCLOBENZAPRINE HCL 10 MG PO TABS
10.0000 mg | ORAL_TABLET | Freq: Three times a day (TID) | ORAL | Status: DC | PRN
  Filled 2015-06-17: qty 1

## 2015-06-17 MED ORDER — DIBUCAINE 1 % RE OINT: 1.0000 "application " | TOPICAL_OINTMENT | RECTAL | Status: DC | PRN

## 2015-06-17 MED ORDER — FENTANYL 2.5 MCG/ML BUPIVACAINE 1/10 % EPIDURAL INFUSION (WH - ANES)
14.0000 mL/h | INTRAMUSCULAR | Status: DC | PRN
  Administered 2015-06-17 (×2): 14 mL/h via EPIDURAL
  Filled 2015-06-17 (×2): qty 125

## 2015-06-17 MED ORDER — WITCH HAZEL-GLYCERIN EX PADS: 1.0000 "application " | MEDICATED_PAD | CUTANEOUS | Status: DC | PRN

## 2015-06-17 MED ORDER — PRENATAL MULTIVITAMIN CH
1.0000 | ORAL_TABLET | Freq: Every day | ORAL | Status: DC
  Administered 2015-06-18: 1 via ORAL
  Filled 2015-06-17: qty 1

## 2015-06-17 MED ORDER — ONDANSETRON HCL 4 MG/2ML IJ SOLN: 4.0000 mg | INTRAMUSCULAR | Status: DC | PRN

## 2015-06-17 MED ORDER — LIDOCAINE HCL (PF) 1 % IJ SOLN
INTRAMUSCULAR | Status: DC | PRN
  Administered 2015-06-17 (×2): 4 mL via EPIDURAL

## 2015-06-17 NOTE — Progress Notes (Signed)
   Andrea BeckJoanna L Guertin is a 34 y.o. [id number] at 1744w3d  admitted for induction of labor due to Poor fetal growth.  Subjective: Comfortable with epidural  Objective: Filed Vitals:   06/17/15 0701 06/17/15 0730 06/17/15 0731 06/17/15 0802  BP: 127/68 130/73  106/68  Pulse: 83 76  80  Temp:   98 F (36.7 C)   TempSrc:   Oral   Resp: 18  16   Height:      Weight:      SpO2:          FHT:  FHR: 130 bpm, variability: moderate,  accelerations:  Present,  decelerations:  Absent UC:   irregular, every 1-4 minutes SVE:   Dilation: 5 Effacement (%): 70 Station: -2 Exam by:: Genelle Bale. Johnson, RN  Pitocin @ 12 mu/min  Labs: Lab Results  Component Value Date   WBC 13.4* 06/17/2015   HGB 8.0* 06/17/2015   HCT 23.8* 06/17/2015   MCV 91.9 06/17/2015   PLT 381 06/17/2015    Assessment / Plan: Induction of labor due to IUGR,  progressing well on pitocin GHTN:  BP decent s/p PO labetalol Labor: Progressing normally Fetal Wellbeing:  Category I Pain Control:  Epidural Anticipated MOD:  NSVD  CRESENZO-DISHMAN,Edrian Melucci 06/17/2015, 8:05 AM

## 2015-06-17 NOTE — Progress Notes (Signed)
Andrea [name] is a 34 y.o. Negative (10/13 0000) [id number] at 6329w3d by 8 week ultrasound admitted for IOL for IUGR < 3%. Subjective: Patient reports feeling comfortable. Epidural in place.  Objective:. Filed Vitals:   06/17/15 1401 06/17/15 1429 06/17/15 1430 06/17/15 1431  BP: 135/77 135/69 135/69 135/69  Pulse: 83 78 78 78    GEN: Appears comfortable, resting in bed. Family at the bedside. FHT:  HR 145 / Moderate / +accels / early deccels, no variables or lates  UC:   regular, every 2-3 minutes SVE:  4.5 / 60% / -2  Assessment / Plan: AROM performed at approx.1355 with clear fluid noted IUPC placed Patient tolerated the procedures well S/p Foley bulb Pitocin on 1820mu/min  Routinue routine L&D care and management  Gerri Sporeaitlin Sullivan, MD 06/17/2015, 2:34 PM  I have seen and examined this patient and I agree with the above. Andrea [name], Andrea [name] CNM 3:14 PM 06/17/2015

## 2015-06-17 NOTE — Progress Notes (Signed)
Andrea [name] is a 34 y.o. GBS Negative [phone] at 6151w3d by ultrasound at [redacted] weeks EGA admitted for IOL d/t IUGR.  Subjective: Patient reports feeling comfortable. Epidural in place.   Objective:. Filed Vitals:   06/17/15 0830 06/17/15 0831 06/17/15 0900 06/17/15 0930  BP: 109/45 109/45 114/64 122/52  Pulse: 69 69 76 83  Temp: 98.4 F (36.9 C)   98.4 F (36.9 C)  Resp: 16   20    GEN: Appears comfortable, resting in bed. Family at the bedside. FHT:  FHR: 125 bpm, variability: moderate,  accelerations:  Present,  decelerations:  Absent UC:   regular, every 3 minutes SVE:     Dilation: 4  Effacement (%): 60  Station: -2  Exam by: Dionne MiloShaw, [name]; Sullivan, MD  s/p Foley bulb Membranes intact Pitocin on @ 16 Continue routine L&D care and management.  Andrea Sporeaitlin Sullivan MD 06/17/2015, 9:53 AM   I have seen and examined this patient and I agree with the above. Andrea [name], Andrea [name] 3:18 PM 06/17/2015

## 2015-06-17 NOTE — Progress Notes (Signed)
MD on call notified for BP of 146/68.  Pt asymptomatic but BP appears to be trending up.  MD reported that she would reorder BP medication for pt.  Will continue to monitor.

## 2015-06-17 NOTE — Anesthesia Preprocedure Evaluation (Addendum)
Anesthesia Evaluation  Patient identified by MRN, date of birth, ID band Patient awake    Reviewed: Allergy & Precautions, NPO status , Patient's Chart, lab work & pertinent test results  Airway Mallampati: II  TM Distance: >3 FB Neck ROM: Full    Dental  (+) Teeth Intact   Pulmonary Current Smoker,    breath sounds clear to auscultation       Cardiovascular hypertension,  Rhythm:Regular Rate:Normal     Neuro/Psych negative neurological ROS  negative psych ROS   GI/Hepatic Neg liver ROS, GERD  Medicated,  Endo/Other    Renal/GU negative Renal ROS  negative genitourinary   Musculoskeletal negative musculoskeletal ROS (+)   Abdominal   Peds negative pediatric ROS (+)  Hematology negative hematology ROS (+)   Anesthesia Other Findings   Reproductive/Obstetrics (+) Pregnancy                            Lab Results  Component Value Date   WBC 13.4* 06/17/2015   HGB 8.0* 06/17/2015   HCT 23.8* 06/17/2015   MCV 91.9 06/17/2015   PLT 381 06/17/2015   No results found for: INR, PROTIME   Anesthesia Physical Anesthesia Plan  ASA: II  Anesthesia Plan: Epidural   Post-op Pain Management:    Induction:   Airway Management Planned:   Additional Equipment:   Intra-op Plan:   Post-operative Plan:   Informed Consent: I have reviewed the patients History and Physical, chart, labs and discussed the procedure including the risks, benefits and alternatives for the proposed anesthesia with the patient or authorized representative who has indicated his/her understanding and acceptance.     Plan Discussed with:   Anesthesia Plan Comments:         Anesthesia Quick Evaluation

## 2015-06-17 NOTE — Progress Notes (Signed)
   Emilia BeckJoanna L Pokorski is a 34 y.o. [id number] at 2263w3d  admitted for induction of labor due to Poor fetal growth.  Subjective:  Mild ctx Objective: Filed Vitals:   06/16/15 2238 06/16/15 2333 06/16/15 2334 06/17/15 0106  BP: 155/74 144/84 144/84 115/63  Pulse: 84 92 92 86  Temp:    97.6 F (36.4 C)  TempSrc:    Oral  Resp: 18  18 18   Height:      Weight:      foley fell out around 0150    FHT:  FHR: 135 bpm, variability: moderate,  accelerations:  Present,  decelerations:  Absent UC:   irregular, every 5-10 minutes SVE:   Dilation: 4 Effacement (%): 70 Station: -2 Exam by:: Genelle BalE. Johnson, RN  Pitocin @ 4 mu/min  Labs: Lab Results  Component Value Date   WBC 15.7* 06/16/2015   HGB 8.8* 06/16/2015   HCT 26.4* 06/16/2015   MCV 92.6 06/16/2015   PLT 510* 06/16/2015    Assessment / Plan: Induction of labor due to IUGR,  progressing well on pitocin  Labor: Progressing normally Fetal Wellbeing:  Category I Pain Control:  Labor support without medications Anticipated MOD:  NSVD  CRESENZO-DISHMAN,Draiden Mirsky 06/17/2015, 3:12 AM

## 2015-06-17 NOTE — Anesthesia Procedure Notes (Signed)
Epidural Patient location during procedure: OB Start time: 06/17/2015 6:57 AM End time: 06/17/2015 6:05 AM  Staffing Anesthesiologist: Shona SimpsonHOLLIS, Andrea [name] Performed by: anesthesiologist   Preanesthetic Checklist Completed: patient identified, site marked, surgical consent, pre-op evaluation, timeout performed, IV checked, risks and benefits discussed and monitors and equipment checked  Epidural Patient position: sitting Prep: DuraPrep Patient monitoring: heart rate, continuous pulse ox and blood pressure Approach: midline Location: L4-L5 Injection technique: LOR saline  Needle:  Needle type: Tuohy  Needle gauge: 18 G Needle length: 9 cm and 9 Catheter type: closed end flexible Catheter size: 20 Guage Test dose: negative and Other  Assessment Events: blood not aspirated, injection not painful, no injection resistance, negative IV test and no paresthesia  Additional Notes LOR @ 5.5  Patient identified. Risks/Benefits/Options discussed with patient including but not limited to bleeding, infection, nerve damage, paralysis, failed block, incomplete pain control, headache, blood pressure changes, nausea, vomiting, reactions to medications, itching and postpartum back pain. Confirmed with bedside nurse the patient's most recent platelet count. Confirmed with patient that they are not currently taking any anticoagulation, have any bleeding history or any family history of bleeding disorders. Patient expressed understanding and wished to proceed. All questions were answered. Sterile technique was used throughout the entire procedure. Please see nursing notes for vital signs. Test dose was given through epidural catheter and negative prior to continuing to dose epidural or start infusion. Warning signs of high block given to the patient including shortness of breath, tingling/numbness in hands, complete motor block, or any concerning symptoms with instructions to call for help. Patient was given  instructions on fall risk and not to get out of bed. All questions and concerns addressed with instructions to call with any issues or inadequate analgesia.     Patient tolerated the insertion well without complications.Reason for block:procedure for pain

## 2015-06-18 MED ORDER — LABETALOL HCL 200 MG PO TABS: 200.0000 mg | ORAL_TABLET | Freq: Two times a day (BID) | ORAL | Status: DC

## 2015-06-18 MED ORDER — IBUPROFEN 600 MG PO TABS: 600.0000 mg | ORAL_TABLET | Freq: Four times a day (QID) | ORAL | Status: AC

## 2015-06-18 MED ORDER — HYDROCHLOROTHIAZIDE 12.5 MG PO CAPS
12.5000 mg | ORAL_CAPSULE | Freq: Every day | ORAL | Status: DC
  Administered 2015-06-19: 12.5 mg via ORAL
  Filled 2015-06-18: qty 1

## 2015-06-18 NOTE — Discharge Instructions (Signed)

## 2015-06-18 NOTE — Progress Notes (Signed)
NT reported blood pressure of 158/77 gave 200 mg. Labetalol.  No complaints of  Headache or  blurred vision. BP re-taken a hr later 145/72 still no headache or blurred vision reported.

## 2015-06-18 NOTE — Anesthesia Postprocedure Evaluation (Signed)
  Anesthesia Post-op Note  Patient: Andrea [name]  Procedure(s) Performed: * No procedures listed *  Patient Location: Mother/Baby  Anesthesia Type:Epidural  Level of Consciousness: awake, alert  and oriented  Airway and Oxygen Therapy: Patient Spontanous Breathing  Post-op Pain: none  Post-op Assessment: Post-op Vital signs reviewed and Patient's Cardiovascular Status Stable              Post-op Vital Signs: Reviewed and stable  Last Vitals:  Filed Vitals:   06/18/15 0737  BP: 149/72  Pulse: 74  Temp: 37 C  Resp: 18    Complications: No apparent anesthesia complications

## 2015-06-18 NOTE — Discharge Summary (Signed)
OB Discharge Summary     Patient Name: Andrea BeckJoanna L Mehra DOB: December 21, 1980 MRN: [id number]  Date of admission: 06/16/2015 Delivering MD: Cam HaiSHAW, Arrian Manson D   Date of discharge: 06/18/2015  Admitting diagnosis: 38wks, induction Intrauterine pregnancy: 8742w3d     Secondary diagnosis:  Active Problems:   IUGR (intrauterine growth restriction)  Additional problems: Upon arrival pt w/ some elevated BPs and nl labs= gHTN; tx w/ PO Labetalol     Discharge diagnosis: Term Pregnancy Delivered, Gestational Hypertension and IUGR                                                                                                Post partum procedures:none  Augmentation: Pitocin and Foley Balloon  Complications: None  Hospital course:  Induction of Labor With Vaginal Delivery   34 y.o. yo [id number] at 3942w3d was admitted to the hospital 06/16/2015 for induction of labor.  Indication for induction: IUGR, and then gHTN noted upon arrival for IOL.  Patient had an uncomplicated labor course as follows: Membrane Rupture Time/Date: 1:55 PM ,06/17/2015   Intrapartum Procedures: Episiotomy: None [1]                                         Lacerations:  None [1]  Patient had delivery of a Viable infant.  Information for the patient's newborn:  Littie DeedsFain, Girl Meylin [[id number]][[id number]]  Delivery Method: Vag-Spont   06/17/2015  Details of delivery can be found in separate delivery note.  Patient had a routine postpartum course. Patient is discharged home No discharge date for patient encounter.    Physical exam  Filed Vitals:   06/17/15 1817 06/17/15 1946 06/17/15 2346 06/18/15 0610  BP: 145/68 134/62 134/64 138/72  Pulse: 84 90 77 87  Temp: 98.7 F (37.1 C) 98.3 F (36.8 C) 97.6 F (36.4 C) 98.6 F (37 C)  TempSrc: Oral Oral Oral Oral  Resp: 18 16 16 18   Height:      Weight:      SpO2:       General: alert, cooperative and no distress Lochia: appropriate Uterine Fundus: firm Incision: N/A DVT Evaluation:  No evidence of DVT seen on physical exam. Labs: Lab Results  Component Value Date   WBC 16.7* 06/17/2015   HGB 8.3* 06/17/2015   HCT 24.8* 06/17/2015   MCV 92.2 06/17/2015   PLT 362 06/17/2015   CMP Latest Ref Rng 06/16/2015  Glucose 65 - 99 mg/dL 92  BUN 6 - 20 mg/dL <1(H<5(L)  Creatinine 0.860.44 - 1.00 mg/dL 5.780.56  Sodium 469135 - 629145 mmol/L 138  Potassium 3.5 - 5.1 mmol/L 3.5  Chloride 101 - 111 mmol/L 111  CO2 22 - 32 mmol/L 20(L)  Calcium 8.9 - 10.3 mg/dL 5.2(W8.4(L)  Total Protein 6.5 - 8.1 g/dL 6.1(L)  Total Bilirubin 0.3 - 1.2 mg/dL 0.4  Alkaline Phos 38 - 126 U/L 191(H)  AST 15 - 41 U/L 19  ALT 14 - 54 U/L 11(L)    Discharge instruction: per After  Visit Summary and "Baby and Me Booklet".  Medications:  Current facility-administered medications:  .  acetaminophen (TYLENOL) tablet 650 mg, 650 mg, Oral, Q4H PRN, Gerri Spore, MD .  benzocaine-Menthol (DERMOPLAST) 20-0.5 % topical spray 1 application, 1 application, Topical, PRN, Gerri Spore, MD .  cyclobenzaprine (FLEXERIL) tablet 10 mg, 10 mg, Oral, Q8H PRN, Gerri Spore, MD .  witch hazel-glycerin (TUCKS) pad 1 application, 1 application, Topical, PRN **AND** dibucaine (NUPERCAINAL) 1 % rectal ointment 1 application, 1 application, Rectal, PRN, Gerri Spore, MD .  diphenhydrAMINE (BENADRYL) capsule 25 mg, 25 mg, Oral, Q6H PRN, Gerri Spore, MD .  ibuprofen (ADVIL,MOTRIN) tablet 600 mg, 600 mg, Oral, 4 times per day, Gerri Spore, MD, 600 mg at 06/18/15 0604 .  labetalol (NORMODYNE) tablet 200 mg, 200 mg, Oral, BID, Gerri Spore, MD, 200 mg at 06/17/15 1857 .  lanolin ointment, , Topical, PRN, Gerri Spore, MD .  ondansetron [facility]) tablet 4 mg, 4 mg, Oral, Q4H PRN **OR** ondansetron (ZOFRAN) injection 4 mg, 4 mg, Intravenous, Q4H PRN, Gerri Spore, MD .  oxyCODONE-acetaminophen (PERCOCET/ROXICET) 5-325 MG per tablet 1 tablet, 1 tablet, Oral, Q4H PRN, Gerri Spore, MD .  oxyCODONE-acetaminophen  (PERCOCET/ROXICET) 5-325 MG per tablet 2 tablet, 2 tablet, Oral, Q4H PRN, Gerri Spore, MD .  prenatal multivitamin tablet 1 tablet, 1 tablet, Oral, Q1200, Gerri Spore, MD .  senna-docusate (Senokot-S) tablet 2 tablet, 2 tablet, Oral, Q24H, Gerri Spore, MD, 2 tablet at 06/17/15 2348 .  simethicone (MYLICON) chewable tablet 80 mg, 80 mg, Oral, PRN, Gerri Spore, MD .  Tdap (BOOSTRIX) injection 0.5 mL, 0.5 mL, Intramuscular, Once, Gerri Spore, MD    Medication List    STOP taking these medications        acetaminophen 500 MG tablet  Commonly known as:  TYLENOL     BC HEADACHE POWDER PO     cyclobenzaprine 10 MG tablet  Commonly known as:  FLEXERIL     omeprazole 20 MG capsule  Commonly known as:  PRILOSEC      TAKE these medications        FLINSTONES GUMMIES OMEGA-3 DHA PO  Take 2 tablets by mouth daily.     ibuprofen 600 MG tablet  Commonly known as:  ADVIL,MOTRIN  Take 1 tablet (600 mg total) by mouth every 6 (six) hours.     labetalol 200 MG tablet  Commonly known as:  NORMODYNE  Take 1 tablet (200 mg total) by mouth 2 (two) times daily.        Diet: routine diet  Activity: Advance as tolerated. Pelvic rest for 6 weeks.   Outpatient follow up:2 weeks- for BP check Follow up Appt:Future Appointments Date Time Provider Department Center  07/28/2015 11:30 AM Jacklyn Shell, CNM [facility]   Follow up Visit:No Follow-up on file.  Postpartum contraception: Tubal Ligation- interval  Newborn Data: Live born female  Birth Weight: 5 lb 14.2 oz (2671 g) APGAR: 9, 9  Baby Feeding: Breast Disposition:home with mother   06/18/2015 Cam Hai, CNM  7:26 AM

## 2015-06-19 DIAGNOSIS — O139 Gestational [pregnancy-induced] hypertension without significant proteinuria, unspecified trimester: Secondary | ICD-10-CM

## 2015-06-19 MED ORDER — HYDROCHLOROTHIAZIDE 12.5 MG PO CAPS: 12.5000 mg | ORAL_CAPSULE | Freq: Every day | ORAL | Status: DC

## 2015-06-19 NOTE — Discharge Summary (Signed)
OB Discharge Summary  Patient Name: Andrea [name] DOB: [birth year] MRN: [id number]  Date of admission: 06/16/2015 Delivering MD: Cam Hai D   Date of discharge: 06/19/2015  Admitting diagnosis: 38wks, induction Intrauterine pregnancy: [redacted]w[redacted]d     Secondary diagnosis:Active Problems:   IUGR (intrauterine growth restriction)   NSVD (normal spontaneous vaginal delivery)   Gestational hypertension  Additional problems: gHTN    Discharge diagnosis: Term Pregnancy Delivered                                                                     Post partum procedures:None  Augmentation: AROM, Pitocin and Foley Balloon  Complications: None  Active Problems:   IUGR (intrauterine growth restriction)   NSVD (normal spontaneous vaginal delivery)   Gestational hypertension   Hospital course:  Induction of Labor With Vaginal Delivery   34 y.o. yo [phone] at [redacted]w[redacted]d was admitted to the hospital 06/16/2015 for induction of labor.  Indication for induction: IUGR < 3%.  Patient had an uncomplicated labor course as follows: Membrane Rupture Time/Date: 1:55 PM ,06/17/2015   Intrapartum Procedures: Episiotomy: None [1]                                         Lacerations:  None [1]  Patient had delivery of a Viable infant.  Information for the patient's newborn:  Shaquan, Puerta [[id number]]  Delivery Method: Vag-Spont    06/17/2015  Details of delivery can be found in separate delivery note.  Patient had a routine postpartum course. Patient is discharged home No discharge date for patient encounter.   Physical exam  Filed Vitals:   06/18/15 2028 06/19/15 0559 06/19/15 1010 06/19/15 1040  BP: 145/72 140/72 148/92 137/78  Pulse: 79 82  81  Temp: 98.5 F (36.9 C) 99 F (37.2 C)    TempSrc: Oral Oral    Resp: 18 16    Height:      Weight:      SpO2:       General: alert, cooperative and no distress Lochia: appropriate Uterine Fundus: firm Incision: N/A DVT Evaluation: No  evidence of DVT seen on physical exam. Labs: Lab Results  Component Value Date   WBC 16.7* 06/17/2015   HGB 8.3* 06/17/2015   HCT 24.8* 06/17/2015   MCV 92.2 06/17/2015   PLT 362 06/17/2015   CMP Latest Ref Rng 06/16/2015  Glucose 65 - 99 mg/dL 92  BUN 6 - 20 mg/dL <1(H)  Creatinine 0.86 - 1.00 mg/dL 5.78  Sodium 469 - 629 mmol/L 138  Potassium 3.5 - 5.1 mmol/L 3.5  Chloride 101 - 111 mmol/L 111  CO2 22 - 32 mmol/L 20(L)  Calcium 8.9 - 10.3 mg/dL 5.2(W)  Total Protein 6.5 - 8.1 g/dL 6.1(L)  Total Bilirubin 0.3 - 1.2 mg/dL 0.4  Alkaline Phos 38 - 126 U/L 191(H)  AST 15 - 41 U/L 19  ALT 14 - 54 U/L 11(L)    Discharge instruction: per After Visit Summary and "Baby and Me Booklet".  Medications:  Current facility-administered medications:  .  acetaminophen (TYLENOL) tablet 650 mg, 650 mg, Oral, Q4H PRN, Luther Parody  Lendell CapriceSullivan, MD .  benzocaine-Menthol (DERMOPLAST) 20-0.5 % topical spray 1 application, 1 application, Topical, PRN, Gerri Sporeaitlin Sullivan, MD .  cyclobenzaprine (FLEXERIL) tablet 10 mg, 10 mg, Oral, Q8H PRN, Gerri Sporeaitlin Sullivan, MD .  witch hazel-glycerin (TUCKS) pad 1 application, 1 application, Topical, PRN **AND** dibucaine (NUPERCAINAL) 1 % rectal ointment 1 application, 1 application, Rectal, PRN, Gerri Sporeaitlin Sullivan, MD .  diphenhydrAMINE (BENADRYL) capsule 25 mg, 25 mg, Oral, Q6H PRN, Gerri Sporeaitlin Sullivan, MD .  hydrochlorothiazide (MICROZIDE) capsule 12.5 mg, 12.5 mg, Oral, Daily, Kathrynn RunningNoah Bedford Wouk, MD, 12.5 mg at 06/19/15 1018 .  ibuprofen (ADVIL,MOTRIN) tablet 600 mg, 600 mg, Oral, 4 times per day, Gerri Sporeaitlin Sullivan, MD, 600 mg at 06/19/15 0523 .  lanolin ointment, , Topical, PRN, Gerri Sporeaitlin Sullivan, MD .  ondansetron [facility](ZOFRAN) tablet 4 mg, 4 mg, Oral, Q4H PRN **OR** ondansetron (ZOFRAN) injection 4 mg, 4 mg, Intravenous, Q4H PRN, Gerri Sporeaitlin Sullivan, MD .  oxyCODONE-acetaminophen (PERCOCET/ROXICET) 5-325 MG per tablet 1 tablet, 1 tablet, Oral, Q4H PRN, Gerri Sporeaitlin Sullivan, MD .   oxyCODONE-acetaminophen (PERCOCET/ROXICET) 5-325 MG per tablet 2 tablet, 2 tablet, Oral, Q4H PRN, Gerri Sporeaitlin Sullivan, MD .  prenatal multivitamin tablet 1 tablet, 1 tablet, Oral, Q1200, Gerri Sporeaitlin Sullivan, MD, 1 tablet at 06/18/15 1128 .  senna-docusate (Senokot-S) tablet 2 tablet, 2 tablet, Oral, Q24H, Gerri Sporeaitlin Sullivan, MD, 2 tablet at 06/17/15 2348 .  simethicone (MYLICON) chewable tablet 80 mg, 80 mg, Oral, PRN, Gerri Sporeaitlin Sullivan, MD .  Tdap (BOOSTRIX) injection 0.5 mL, 0.5 mL, Intramuscular, Once, Gerri Sporeaitlin Sullivan, MD, 0.5 mL at 06/18/15 1000 After Visit Meds:    Medication List    STOP taking these medications        acetaminophen 500 MG tablet  Commonly known as:  TYLENOL     BC HEADACHE POWDER PO     cyclobenzaprine 10 MG tablet  Commonly known as:  FLEXERIL     omeprazole 20 MG capsule  Commonly known as:  PRILOSEC      TAKE these medications        FLINSTONES GUMMIES OMEGA-3 DHA PO  Take 2 tablets by mouth daily.     hydrochlorothiazide 12.5 MG capsule  Commonly known as:  MICROZIDE  Take 1 capsule (12.5 mg total) by mouth daily.     ibuprofen 600 MG tablet  Commonly known as:  ADVIL,MOTRIN  Take 1 tablet (600 mg total) by mouth every 6 (six) hours.        Diet: low salt diet  Activity: Advance as tolerated. Pelvic rest for 6 weeks.   Outpatient follow up:6 weeks Follow up Appt: Patient to schedule appointment for BP check at clinic within 2 weeks from discharge.  Future Appointments Date Time Provider Department Center  07/28/2015 11:30 AM Jacklyn ShellFrances Cresenzo-Dishmon, CNM [facility]   Postpartum contraception: Interval BTL  Newborn Data: Live born female  Birth Weight: 5 lb 14.2 oz (2671 g) APGAR: 9, 9  Baby Feeding: Bottle Disposition:home with mother   06/19/2015 Jamelle HaringHillary M Fitzgerald, MD  [facility], PGY-1

## 2015-06-19 NOTE — Progress Notes (Signed)
Pt's BP checked prior to giving microzide 12.5mg .  BP was 148/92 and pt was given microzole 12.5mg .  Rechecked patient's BP prior to D/C and it was 137/78. Pt was instructed of s/s of increased BP and when to call MD.  Patient also instructed of dietary changes to help decrease BP.

## 2015-07-11 DIAGNOSIS — Z029 Encounter for administrative examinations, unspecified: Secondary | ICD-10-CM

## 2015-07-19 ENCOUNTER — Ambulatory Visit: Payer: Medicaid Other | Admitting: Advanced Practice Midwife

## 2015-07-25 ENCOUNTER — Encounter: Payer: Medicaid Other | Admitting: Obstetrics & Gynecology

## 2015-07-27 ENCOUNTER — Ambulatory Visit: Payer: Medicaid Other | Admitting: Advanced Practice Midwife

## 2015-07-28 ENCOUNTER — Ambulatory Visit: Payer: Medicaid Other | Admitting: Advanced Practice Midwife

## 2015-08-01 ENCOUNTER — Encounter: Payer: Medicaid Other | Admitting: Obstetrics & Gynecology

## 2015-09-24 ENCOUNTER — Other Ambulatory Visit: Payer: Self-pay | Admitting: Internal Medicine

## 2015-09-24 ENCOUNTER — Other Ambulatory Visit: Payer: Self-pay | Admitting: Obstetrics and Gynecology

## 2015-09-30 ENCOUNTER — Other Ambulatory Visit: Payer: Self-pay | Admitting: *Deleted

## 2015-09-30 MED ORDER — LABETALOL HCL 200 MG PO TABS: 200.0000 mg | ORAL_TABLET | Freq: Two times a day (BID) | ORAL | Status: DC

## 2015-09-30 NOTE — Telephone Encounter (Signed)
Will refil x 30 days. Pt to be contaccted to make appt for BP recheck, and consideration of moving to Norvasc or other QD regimen.

## 2015-09-30 NOTE — Telephone Encounter (Signed)
refil labetalol x 2 mos pt needs appt.

## 2021-11-22 HISTORY — PX: ANKLE FRACTURE SURGERY: SHX122

## 2022-07-19 ENCOUNTER — Emergency Department (HOSPITAL_COMMUNITY)
Admission: EM | Admit: 2022-07-19 | Discharge: 2022-07-19 | Disposition: A | Discharge status: Home / Self Care | Payer: Medicaid Other | Attending: Student | Admitting: Student

## 2022-07-19 ENCOUNTER — Encounter (HOSPITAL_COMMUNITY): Payer: Self-pay

## 2022-07-19 ENCOUNTER — Emergency Department (HOSPITAL_COMMUNITY): Payer: Medicaid Other

## 2022-07-19 ENCOUNTER — Other Ambulatory Visit: Payer: Self-pay

## 2022-07-19 DIAGNOSIS — I10 Essential (primary) hypertension: Secondary | ICD-10-CM | POA: Diagnosis present

## 2022-07-19 DIAGNOSIS — I160 Hypertensive urgency: Secondary | ICD-10-CM | POA: Diagnosis not present

## 2022-07-19 DIAGNOSIS — R350 Frequency of micturition: Secondary | ICD-10-CM | POA: Insufficient documentation

## 2022-07-19 DIAGNOSIS — Z79899 Other long term (current) drug therapy: Secondary | ICD-10-CM | POA: Insufficient documentation

## 2022-07-19 DIAGNOSIS — R519 Headache, unspecified: Secondary | ICD-10-CM | POA: Insufficient documentation

## 2022-07-19 HISTORY — DX: Essential (primary) hypertension: I10

## 2022-07-19 LAB — URINALYSIS, ROUTINE W REFLEX MICROSCOPIC
Bilirubin Urine: NEGATIVE
Glucose, UA: NEGATIVE mg/dL
Hgb urine dipstick: NEGATIVE
Ketones, ur: NEGATIVE mg/dL
Leukocytes,Ua: NEGATIVE
Nitrite: NEGATIVE
Protein, ur: NEGATIVE mg/dL
Specific Gravity, Urine: 1.013 (ref 1.005–1.030)
pH: 6 (ref 5.0–8.0)

## 2022-07-19 LAB — CBC
HCT: 39.5 % (ref 36.0–46.0)
Hemoglobin: 13.2 g/dL (ref 12.0–15.0)
MCH: 30.7 pg (ref 26.0–34.0)
MCHC: 33.4 g/dL (ref 30.0–36.0)
MCV: 91.9 fL (ref 80.0–100.0)
Platelets: 313 10*3/uL (ref 150–400)
RBC: 4.3 MIL/uL (ref 3.87–5.11)
RDW: 14.4 % (ref 11.5–15.5)
WBC: 5.9 10*3/uL (ref 4.0–10.5)
nRBC: 0 % (ref 0.0–0.2)

## 2022-07-19 LAB — BASIC METABOLIC PANEL
Anion gap: 5 (ref 5–15)
BUN: 13 mg/dL (ref 6–20)
CO2: 26 mmol/L (ref 22–32)
Calcium: 9 mg/dL (ref 8.9–10.3)
Chloride: 111 mmol/L (ref 98–111)
Creatinine, Ser: 1.05 mg/dL — ABNORMAL HIGH (ref 0.44–1.00)
GFR, Estimated: >60 mL/min (ref >60)
Glucose, Bld: 92 mg/dL (ref 70–99)
Potassium: 3.4 mmol/L — ABNORMAL LOW (ref 3.5–5.1)
Sodium: 142 mmol/L (ref 135–145)

## 2022-07-19 LAB — TROPONIN I (HIGH SENSITIVITY): Troponin I (High Sensitivity): 4 ng/L (ref <18)

## 2022-07-19 MED ORDER — HYDRALAZINE HCL 20 MG/ML IJ SOLN
10.0000 mg | Freq: Once | INTRAMUSCULAR | Status: AC
  Administered 2022-07-19: 10 mg via INTRAVENOUS
  Filled 2022-07-19: qty 1

## 2022-07-19 MED ORDER — LORAZEPAM 0.5 MG PO TABS
0.5000 mg | ORAL_TABLET | Freq: Once | ORAL | Status: AC
  Administered 2022-07-19: 0.5 mg via ORAL
  Filled 2022-07-19: qty 1

## 2022-07-19 MED ORDER — AMLODIPINE BESYLATE 5 MG PO TABS: 5.0000 mg | ORAL_TABLET | Freq: Every day | ORAL | 0 refills | Status: DC

## 2022-07-19 NOTE — ED Provider Notes (Signed)
[facility] EMERGENCY DEPARTMENT Provider Note   CSN: [id number] Arrival date & time: 07/19/22  1656     History  Chief Complaint  Patient presents with   Hypertension    Andrea [name] is a 41 y.o. female with a history significant only for gestational hypertension who was treated years ago with labetalol and hydrochlorothiazide but states these medications made her feel "terrible" reporting dizziness and inability to even get off the couch when on these medications so just stopped taking them.  She has noticed increased frequency of headache which today is severe, gradual onset this morning.  She was seen by provider last week and treated for a dental abscess which has improved with antibiotics but at that time was told her blood pressure was fairly elevated.  She has been checking her blood pressures all week with a home device and continues to get systolic pressures above 200.  When her headache started today she became concerned that something more serious was occurring.  She denies any focal weakness or numbness, she does report blurred vision which has been a chronic finding for months and suspects her vision has changed since her last formal eye exam over 3 years ago (wears contacts)  She also endorses fleeting episodes of bilateral upper extremity numbness which will last for 5 to 10 seconds while she is performing activities at her home such as housework.  She last felt this within the past several weeks.  She denies neck pain or injury.  The history is provided by the patient.       Home Medications Prior to Admission medications   Medication Sig Start Date End Date Taking? Authorizing Provider  hydrochlorothiazide (MICROZIDE) 12.5 MG capsule Take 1 capsule (12.5 mg total) by mouth daily. 06/19/15   Casey Burkitt, MD  ibuprofen (ADVIL,MOTRIN) 600 MG tablet Take 1 tablet (600 mg total) by mouth every 6 (six) hours. 06/18/15   Arabella Merles, CNM  labetalol  (NORMODYNE) 200 MG tablet TAKE 1 TABLET BY MOUTH TWICE DAILY 09/30/15   Tilda Burrow, MD  labetalol (NORMODYNE) 200 MG tablet Take 1 tablet (200 mg total) by mouth 2 (two) times daily. 09/30/15   Tilda Burrow, MD  Pediatric Multiple Vit-C-FA (FLINSTONES GUMMIES OMEGA-3 DHA PO) Take 2 tablets by mouth daily.     [provider]      Allergies    Sulfa antibiotics    Review of Systems   Review of Systems  Constitutional:  Negative for fever.  HENT:  Negative for congestion and sore throat.   Eyes:  Positive for visual disturbance.  Respiratory:  Negative for chest tightness and shortness of breath.   Cardiovascular:  Negative for chest pain.  Gastrointestinal:  Negative for abdominal pain, nausea and vomiting.  Genitourinary: Negative.   Musculoskeletal:  Negative for arthralgias, joint swelling and neck pain.  Skin: Negative.  Negative for rash and wound.  Neurological:  Positive for headaches. Negative for dizziness, weakness, light-headedness and numbness.  Psychiatric/Behavioral: Negative.    All other systems reviewed and are negative.   Physical Exam Updated Vital Signs BP (!) 221/117 (BP Location: Right Arm)   Pulse 75   Temp 98.5 F (36.9 C) (Oral)   Resp 18   Ht 5\' 2"  (1.575 m)   Wt 67.4 kg   SpO2 96%   BMI 27.16 kg/m  Physical Exam Vitals and nursing note reviewed.  Constitutional:      Appearance: She is well-developed.  HENT:  Head: Normocephalic and atraumatic.  Eyes:     Extraocular Movements:     Right eye: Normal extraocular motion.     Conjunctiva/sclera: Conjunctivae normal.     Pupils: Pupils are equal, round, and reactive to light.  Cardiovascular:     Rate and Rhythm: Normal rate and regular rhythm.     Heart sounds: Normal heart sounds.  Pulmonary:     Effort: Pulmonary effort is normal.     Breath sounds: Normal breath sounds. No wheezing.  Abdominal:     General: Bowel sounds are normal.     Palpations: Abdomen is soft.      Tenderness: There is no abdominal tenderness.  Musculoskeletal:        General: Normal range of motion.     Cervical back: Normal range of motion.  Skin:    General: Skin is warm and dry.  Neurological:     General: No focal deficit present.     Mental Status: She is alert and oriented to person, place, and time.     Cranial Nerves: Cranial nerves 2-12 are intact.     Sensory: Sensation is intact.     Motor: Motor function is intact. No weakness or pronator drift.     Coordination: Heel to [facility] Test normal. Rapid alternating movements normal.     Comments: Equal grip strength.     ED Results / Procedures / Treatments   Labs (all labs ordered are listed, but only abnormal results are displayed) Labs Reviewed  BASIC METABOLIC PANEL  CBC  URINALYSIS, ROUTINE W REFLEX MICROSCOPIC    EKG None  Radiology No results found.  Procedures Procedures    Medications Ordered in ED Medications  hydrALAZINE (APRESOLINE) injection 10 mg (has no administration in time range)    ED Course/ Medical Decision Making/ A&P                           Medical Decision Making Patient presenting with hypertensive urgency.  Labs and imaging pending at this time.  Hydralazine 10 mg ordered.  Pt care assumed by Dr. Posey Rea.           Final Clinical Impression(s) / ED Diagnoses Final diagnoses:  Hypertensive urgency    Rx / DC Orders ED Discharge Orders     None         Victoriano Lain 07/19/22 1747    Kommor, Wyn Forster, MD 07/20/22 1325

## 2022-07-19 NOTE — ED Triage Notes (Addendum)
Pt here from home with c/o of headache and hypertension. Pt says she went to urgent care about a week ago for abscess tooth and was told her BP was high, so she has been monitoring it and remains high per pt. Pt says today she has headache and got worried about BP being so, so came here for evaluation. Pt reports she was dx with HTN after having daughter in 2016, was put on BP meds, but they made her feel bad, so she quit taking them.

## 2022-07-19 NOTE — ED Notes (Signed)
Went over d/c papers. All questions. Ambulatory to lobby with family.  ?

## 2022-10-26 ENCOUNTER — Encounter: Payer: Self-pay | Admitting: Neurology

## 2022-11-20 ENCOUNTER — Encounter: Payer: Self-pay | Admitting: Neurology

## 2022-11-20 ENCOUNTER — Ambulatory Visit: Payer: Medicaid Other | Admitting: Neurology

## 2022-11-20 VITALS — BP 109/70 | HR 65 | Ht 62.0 in | Wt 145.2 lb

## 2022-11-20 DIAGNOSIS — R55 Syncope and collapse: Secondary | ICD-10-CM | POA: Diagnosis not present

## 2022-11-20 NOTE — Progress Notes (Signed)
NEUROLOGY CONSULTATION NOTE  Andrea [name] MRN: [id number] DOB: 01/06/1981  Referring provider: Rubin Payor, FNP Primary care provider: none listed  Reason for consult:  transient alteration of awareness   Thank you for your kind referral of Andrea [name] for consultation of the above symptoms. Although her history is well known to you, please allow me to reiterate it for the purpose of our medical record. She is alone in the office today. Records and images were personally reviewed where available.   HISTORY OF PRESENT ILLNESS: This is a pleasant 42 year old right-handed woman with a history of hypertension, anxiety, depression, presenting for evaluation of recurrent syncope. The first episode occurred mid-February, she was feeling fine walking into their house when without warning she lost consciousness, falling face forward. Her daughter tried to help her out, per husband she was out for 30 seconds, no jerking movements seen. She woke up on the ground, no tongue bite or incontinence. She felt weak all over and took a few minutes to get to herself, she lay down and went to bed without eating dinner. The second episode occurred 10/24/22, she was sitting on the couch with her daughter at their skate shop when her daughter reported that all of a sudden her head slumped back with eyes closed then she would lift her head up and had 2 more similar episodes. Her daughter got her to stand up but she was weak  and then went to sleep. Daughter reports the loss of consciousness episodes were very brief but recurrent. She denies any further similar loss of consciousness since then. She reports that since childhood, she has had a habit of staring, sometimes she physically feels like she cannot respond when family calls her. She states she can hear but cannot answer, she has been told she does not blink. Sometimes she gets a whiff of a [place] smell or smells rotting eggs. She has deja vu "a lot," around once a  week. For the past 5 years, she has had episodes where she has burning in both upper arms then they feel so weak she cannot lift them for 15 seconds. This resolves and she is fine, no associated headache, confusion, legs are not affected. They occur 1-2 times a month.   She denies any headaches. She "stays dizzy" for the past year. She describes spinning that comes and goes occurring around 3 times a week. She wakes up and her head is spinning, or if she turns really fast she has to lay down or she would throw up. No diplopia, dysarthria/dysphagia, significant neck/back pain, bowel/bladder dysfunction. She reports palpitations, no chest pain/shortness of breath. Mood is okay most of the time. She was started on Sertraline and prn hydroxyzine after these episodes. She gets 7-8 hours of sleep, no sleep deprivation prior to the episodes. No alcohol. She lives with her husband and 2 children. She was in the ER in 06/2022 for hypertensive urgency, she notes that when her husband checked her BP both times after the episodes, SBP was elevated in the 200s.   She had a normal birth and early development.  There is no history of febrile convulsions, CNS infections such as meningitis/encephalitis, significant traumatic brain injury, neurosurgical procedures, or family history of seizures.   PAST MEDICAL HISTORY: Past Medical History:  Diagnosis Date   AMA (advanced maternal age) multigravida 35+ 11/17/2014   Hypertension    LLQ pain 11/17/2014   Pregnant 11/17/2014   UTI (lower urinary tract infection)  11/17/2014   Vaginal spotting 11/17/2014    PAST SURGICAL HISTORY: Past Surgical History:  Procedure Laterality Date   ANKLE FRACTURE SURGERY Left 11/22/2021   ECTOPIC PREGNANCY SURGERY Right 08/27/2004    MEDICATIONS: Current Outpatient Medications on File Prior to Visit  Medication Sig Dispense Refill   amLODipine (NORVASC) 10 MG tablet Take 10 mg by mouth daily.     ergocalciferol (VITAMIN D2)  1.25 MG (50000 UT) capsule Take 50,000 Units by mouth once a week.     hydrOXYzine (ATARAX) 25 MG tablet Take 25 mg by mouth every 6 (six) hours as needed for anxiety.     ibuprofen (ADVIL,MOTRIN) 600 MG tablet Take 1 tablet (600 mg total) by mouth every 6 (six) hours. 30 tablet 0   sertraline (ZOLOFT) 25 MG tablet Take 25 mg by mouth daily.     No current facility-administered medications on file prior to visit.    ALLERGIES: Allergies  Allergen Reactions   Sulfa Antibiotics Anaphylaxis and Swelling    FAMILY HISTORY: Family History  Problem Relation Age of Onset   Diabetes Mother    Hypertension Mother    Other Mother        heart bypass   Congestive Heart Failure Mother    Diabetes Father    Hypertension Father    Other Father        heart bypass   Other Daughter        Surveyor, minerals Syndrome   Cancer Maternal Grandmother     SOCIAL HISTORY: Social History   Socioeconomic History   Marital status: Married    Spouse name: Not on file   Number of children: Not on file   Years of education: Not on file   Highest education level: Not on file  Occupational History   Not on file  Tobacco Use   Smoking status: Former    Packs/day: 0.25    Years: 18.00    Additional pack years: 0.00    Total pack years: 4.50    Types: Cigarettes    Quit date: 11/20/2021    Years since quitting: 1.0   Smokeless tobacco: Never  Vaping Use   Vaping Use: Every day  Substance and Sexual Activity   Alcohol use: No   Drug use: No   Sexual activity: Yes    Birth control/protection: None  Other Topics Concern   Not on file  Social History Narrative   Are you right handed or left handed? Right handed    Are you currently employed ? 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 No    Who lives with you? husband   What type of home do you live in: 1 story or 2 story?  Few steps she is on the 1st level of a 2 story home       Social Determinants of  Health   Financial Resource Strain: Not on file  Food Insecurity: Not on file  Transportation Needs: Not on file  Physical Activity: Not on file  Stress: Not on file  Social Connections: Not on file  Intimate Partner Violence: Not on file     PHYSICAL EXAM: Vitals:   11/20/22 1017  BP: 109/70  Pulse: 65  SpO2: 99%   General: No acute distress Head:  Normocephalic/atraumatic Skin/Extremities: No rash, no edema Neurological Exam: Mental status: alert and oriented to person, place, and time, no dysarthria or aphasia, Fund of knowledge is  appropriate.  Recent and remote memory are intact, 3/3 delayed recall.  Attention and concentration are normal.    Cranial nerves: CN I: not tested CN II: pupils equal, round, visual fields intact CN III, IV, VI:  full range of motion, no nystagmus, no ptosis CN V: facial sensation intact CN VII: upper and lower face symmetric CN VIII: hearing intact to conversation Bulk & Tone: normal, no fasciculations. Motor: 5/5 throughout with no pronator drift. Sensation: intact to light touch, cold, pin on both UE, decreased cold and pin on left foot since ankle surgery. Intact vibration sense. Romberg test negative Deep Tendon Reflexes: brisk +2 throughout, +Hoffman on right Cerebellar: no incoordination on finger to nose testing Gait: narrow-based and steady, able to tandem walk adequately. Tremor: none   IMPRESSION: This is a pleasant 42 year old right-handed woman with a history of hypertension, anxiety, depression, presenting for evaluation of recurrent syncope. She had 2 episodes of sudden loss of consciousness with no jerking/convulsive activity, no prior warning symptoms, last episode on 10/24/22. Etiology unclear, syncope versus seizure. From a neurological standpoint, MRI brain with and without contrast and a 1-hour EEG will be ordered. If normal, we will do a 24-hour EEG. She will be referred to Cardiology for recurrent syncope.  driving laws  were discussed with the patient, and she knows to stop driving after an episode of loss of consciousness until 6 months event-free. Follow-up after tests, call for any changes.   Thank you for allowing me to participate in the care of this patient. Please do not hesitate to call for any questions or concerns.   Ellouise Newer, M.D.  CC: Rubin Payor, FNP

## 2022-11-20 NOTE — Patient Instructions (Signed)
Good to meet you.  Schedule MRI brain with and without contrast  2. Schedule 1-hour EEG. If normal, we will do a 24-hour EEG  3. Referral will be sent to Cardiology  4. It is prudent to recommend that all persons should be free of syncopal (passing out) episodes for at least six months to be granted the driving privilege." ([place], Second Edition, Medical Review Branch, Engineer, site, Division of [employer], July 2004)  5. Follow-up after tests, call for any changes

## 2022-12-05 ENCOUNTER — Other Ambulatory Visit: Payer: Medicaid Other

## 2022-12-05 ENCOUNTER — Encounter: Payer: Self-pay | Admitting: Neurology

## 2022-12-18 NOTE — Progress Notes (Deleted)
Haywood Lasso MD Reason for referral-syncope  HPI: 42 year old female for evaluation of syncope at request of Patrcia Dolly, MD.  Current Outpatient Medications  Medication Sig Dispense Refill   amLODipine (NORVASC) 10 MG tablet Take 10 mg by mouth daily.     ergocalciferol (VITAMIN D2) 1.25 MG (50000 UT) capsule Take 50,000 Units by mouth once a week.     hydrOXYzine (ATARAX) 25 MG tablet Take 25 mg by mouth every 6 (six) hours as needed for anxiety.     ibuprofen (ADVIL,MOTRIN) 600 MG tablet Take 1 tablet (600 mg total) by mouth every 6 (six) hours. 30 tablet 0   sertraline (ZOLOFT) 25 MG tablet Take 25 mg by mouth daily.     No current facility-administered medications for this visit.    Allergies  Allergen Reactions   Sulfa Antibiotics Anaphylaxis and Swelling     Past Medical History:  Diagnosis Date   AMA (advanced maternal age) multigravida 35+ 11/17/2014   Hypertension    LLQ pain 11/17/2014   Pregnant 11/17/2014   UTI (lower urinary tract infection) 11/17/2014   Vaginal spotting 11/17/2014    Past Surgical History:  Procedure Laterality Date   ANKLE FRACTURE SURGERY Left 11/22/2021   ECTOPIC PREGNANCY SURGERY Right 08/27/2004    Social History   Socioeconomic History   Marital status: Married    Spouse name: Not on file   Number of children: Not on file   Years of education: Not on file   Highest education level: Not on file  Occupational History   Not on file  Tobacco Use   Smoking status: Former    Packs/day: 0.25    Years: 18.00    Additional pack years: 0.00    Total pack years: 4.50    Types: Cigarettes    Quit date: 11/20/2021    Years since quitting: 1.0   Smokeless tobacco: Never  Vaping Use   Vaping Use: Every day  Substance and Sexual Activity   Alcohol use: No   Drug use: No   Sexual activity: Yes    Birth control/protection: None  Other Topics Concern   Not on file  Social History Narrative   Are you right handed  or left handed? Right handed    Are you currently employed ? 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 No    Who lives with you? husband   What type of home do you live in: 1 story or 2 story?  Few steps she is on the 1st level of a 2 story home       Social Determinants of Health   Financial Resource Strain: Not on file  Food Insecurity: Not on file  Transportation Needs: Not on file  Physical Activity: Not on file  Stress: Not on file  Social Connections: Not on file  Intimate Partner Violence: Not on file    Family History  Problem Relation Age of Onset   Diabetes Mother    Hypertension Mother    Other Mother        heart bypass   Congestive Heart Failure Mother    Diabetes Father    Hypertension Father    Other Father        heart bypass   Other Daughter        Bess Kinds Syndrome   Cancer Maternal Grandmother     ROS: no fevers or chills, productive cough, hemoptysis,  dysphasia, odynophagia, melena, hematochezia, dysuria, hematuria, rash, seizure activity, orthopnea, PND, pedal edema, claudication. Remaining systems are negative.  Physical Exam:   unknown if currently breastfeeding.  General:  Well developed/well nourished in NAD Skin warm/dry Patient not depressed No peripheral clubbing Back-normal HEENT-normal/normal eyelids Neck supple/normal carotid upstroke bilaterally; no bruits; no JVD; no thyromegaly chest - CTA/ normal expansion CV - RRR/normal S1 and S2; no murmurs, rubs or gallops;  PMI nondisplaced Abdomen -NT/ND, no HSM, no mass, + bowel sounds, no bruit 2+ femoral pulses, no bruits Ext-no edema, chords, 2+ DP Neuro-grossly nonfocal  ECG - personally reviewed  A/P  1 syncope-  2 hypertension-  Olga Millers, MD

## 2022-12-25 ENCOUNTER — Ambulatory Visit: Payer: Medicaid Other | Attending: Cardiology | Admitting: Cardiology

## 2022-12-26 ENCOUNTER — Encounter: Payer: Self-pay | Admitting: Cardiology
